# Patient Record
Sex: Male | Born: 1965 | Race: Black or African American | Hispanic: No | Marital: Single | Smoking: Never smoker
Health system: Southern US, Community
[De-identification: ages and names within clinical notes are randomized; demographics above are authoritative.]

## PROBLEM LIST (undated history)

## (undated) DIAGNOSIS — S52501A Unspecified fracture of the lower end of right radius, initial encounter for closed fracture: Secondary | ICD-10-CM

## (undated) DIAGNOSIS — F1010 Alcohol abuse, uncomplicated: Secondary | ICD-10-CM

## (undated) HISTORY — PX: KNEE ARTHROSCOPY: SUR90

---

## 1998-04-05 ENCOUNTER — Emergency Department (HOSPITAL_COMMUNITY): Admission: EM | Admit: 1998-04-05 | Discharge: 1998-04-05 | Payer: Self-pay | Admitting: Emergency Medicine

## 1999-11-14 ENCOUNTER — Emergency Department (HOSPITAL_COMMUNITY): Admission: EM | Admit: 1999-11-14 | Discharge: 1999-11-14 | Payer: Self-pay | Admitting: Emergency Medicine

## 2002-08-04 ENCOUNTER — Emergency Department (HOSPITAL_COMMUNITY): Admission: EM | Admit: 2002-08-04 | Discharge: 2002-08-04 | Payer: Self-pay | Admitting: Emergency Medicine

## 2003-01-17 ENCOUNTER — Emergency Department (HOSPITAL_COMMUNITY): Admission: EM | Admit: 2003-01-17 | Discharge: 2003-01-17 | Payer: Self-pay | Admitting: Emergency Medicine

## 2003-01-17 ENCOUNTER — Encounter: Payer: Self-pay | Admitting: Emergency Medicine

## 2003-04-19 ENCOUNTER — Emergency Department (HOSPITAL_COMMUNITY): Admission: EM | Admit: 2003-04-19 | Discharge: 2003-04-19 | Payer: Self-pay | Admitting: Emergency Medicine

## 2003-04-22 ENCOUNTER — Emergency Department (HOSPITAL_COMMUNITY): Admission: EM | Admit: 2003-04-22 | Discharge: 2003-04-22 | Payer: Self-pay | Admitting: Emergency Medicine

## 2005-09-02 ENCOUNTER — Emergency Department (HOSPITAL_COMMUNITY): Admission: EM | Admit: 2005-09-02 | Discharge: 2005-09-03 | Payer: Self-pay | Admitting: Emergency Medicine

## 2005-11-19 ENCOUNTER — Ambulatory Visit: Payer: Self-pay | Admitting: Family Medicine

## 2005-11-27 ENCOUNTER — Ambulatory Visit: Payer: Self-pay | Admitting: Family Medicine

## 2005-12-05 ENCOUNTER — Ambulatory Visit: Payer: Self-pay | Admitting: *Deleted

## 2005-12-24 HISTORY — PX: ANKLE SURGERY: SHX546

## 2006-02-08 ENCOUNTER — Inpatient Hospital Stay (HOSPITAL_COMMUNITY): Admission: EM | Admit: 2006-02-08 | Discharge: 2006-02-12 | Payer: Self-pay | Admitting: Emergency Medicine

## 2006-02-26 ENCOUNTER — Inpatient Hospital Stay (HOSPITAL_COMMUNITY): Admission: RE | Admit: 2006-02-26 | Discharge: 2006-03-25 | Payer: Self-pay | Admitting: Orthopedic Surgery

## 2006-02-26 ENCOUNTER — Ambulatory Visit: Payer: Self-pay | Admitting: Infectious Diseases

## 2006-02-26 ENCOUNTER — Encounter (INDEPENDENT_AMBULATORY_CARE_PROVIDER_SITE_OTHER): Payer: Self-pay | Admitting: *Deleted

## 2006-03-15 ENCOUNTER — Ambulatory Visit: Payer: Self-pay | Admitting: Cardiology

## 2006-03-15 ENCOUNTER — Encounter: Payer: Self-pay | Admitting: Cardiology

## 2006-03-19 ENCOUNTER — Encounter: Payer: Self-pay | Admitting: Vascular Surgery

## 2006-04-24 ENCOUNTER — Ambulatory Visit: Payer: Self-pay | Admitting: Infectious Diseases

## 2006-05-06 ENCOUNTER — Encounter: Admission: RE | Admit: 2006-05-06 | Discharge: 2006-08-01 | Payer: Self-pay | Admitting: Orthopedic Surgery

## 2006-05-08 IMAGING — CR DG ANKLE COMPLETE 3+V*R*
3 series · 3 of 3 positions shown · non-contrast
Comparison: 02/26/2006.

CLINICAL DATA: Riad fracture.
RIGHT ANKLE ? 3 VIEW:

[t ankle joint ap right]
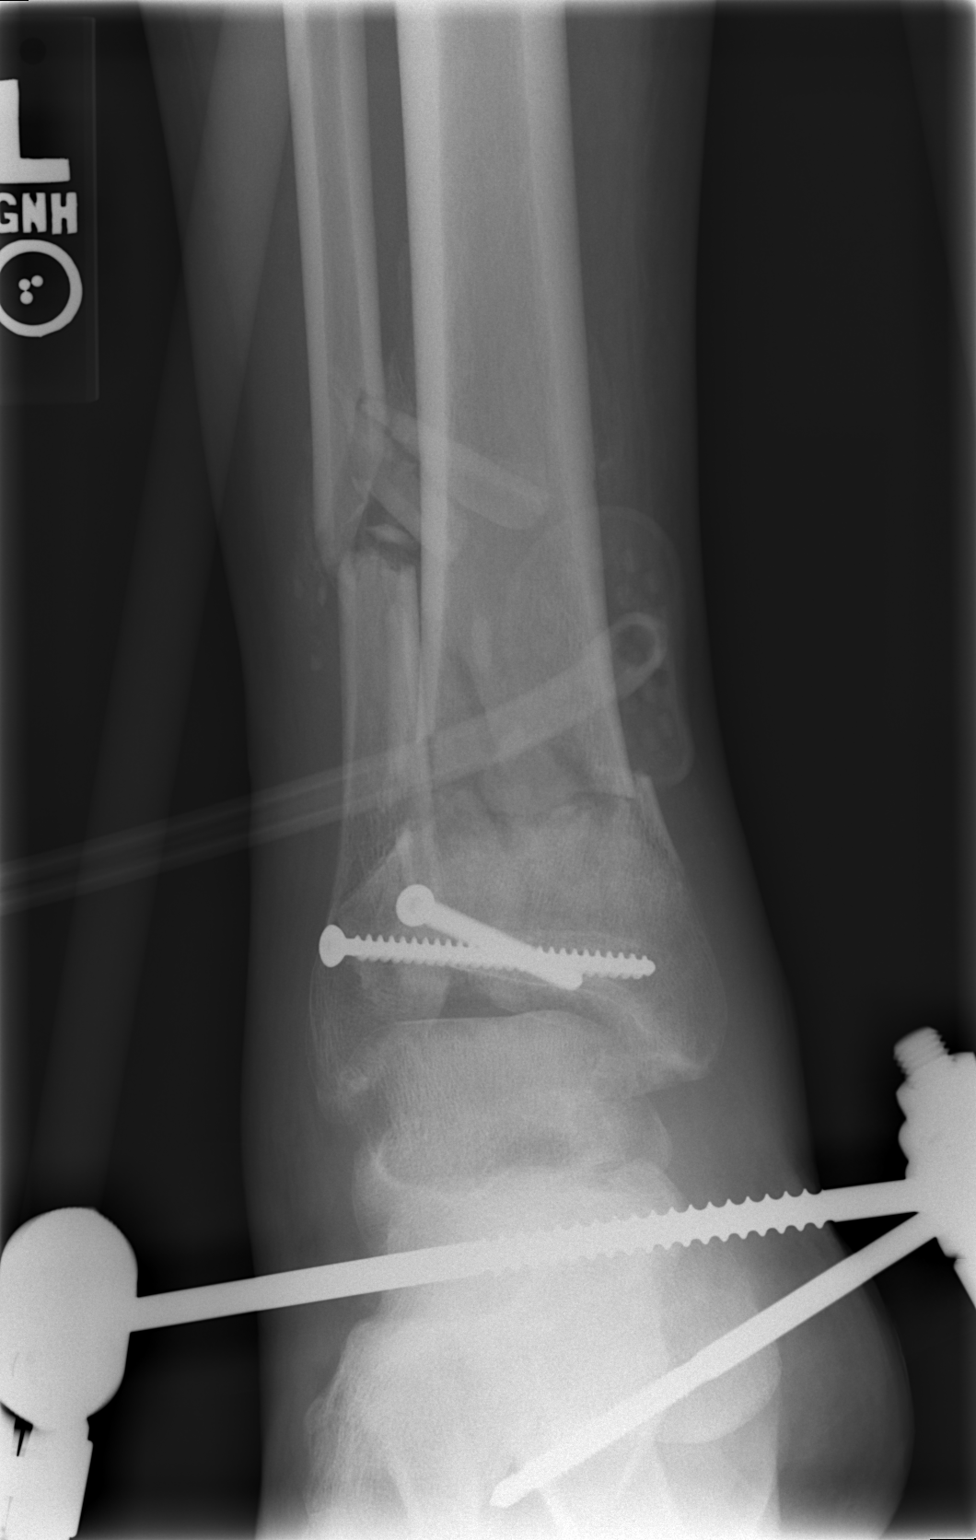

[t ankle joint oblique right]
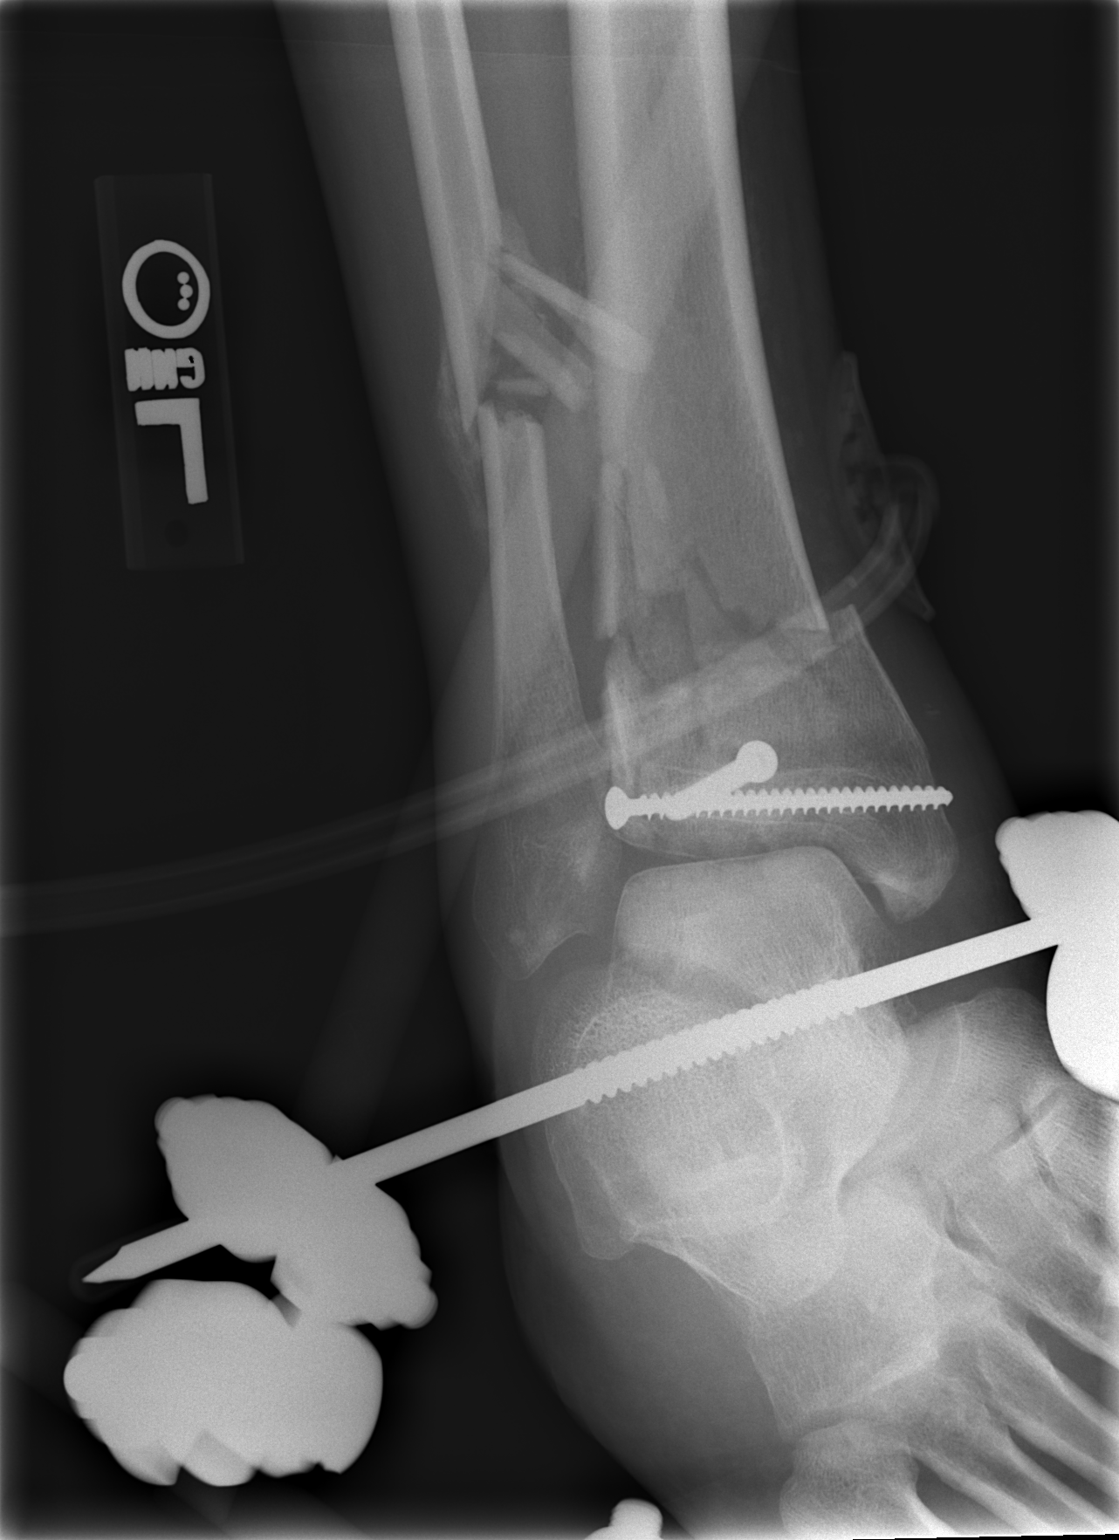

[t ankle joint lat right]
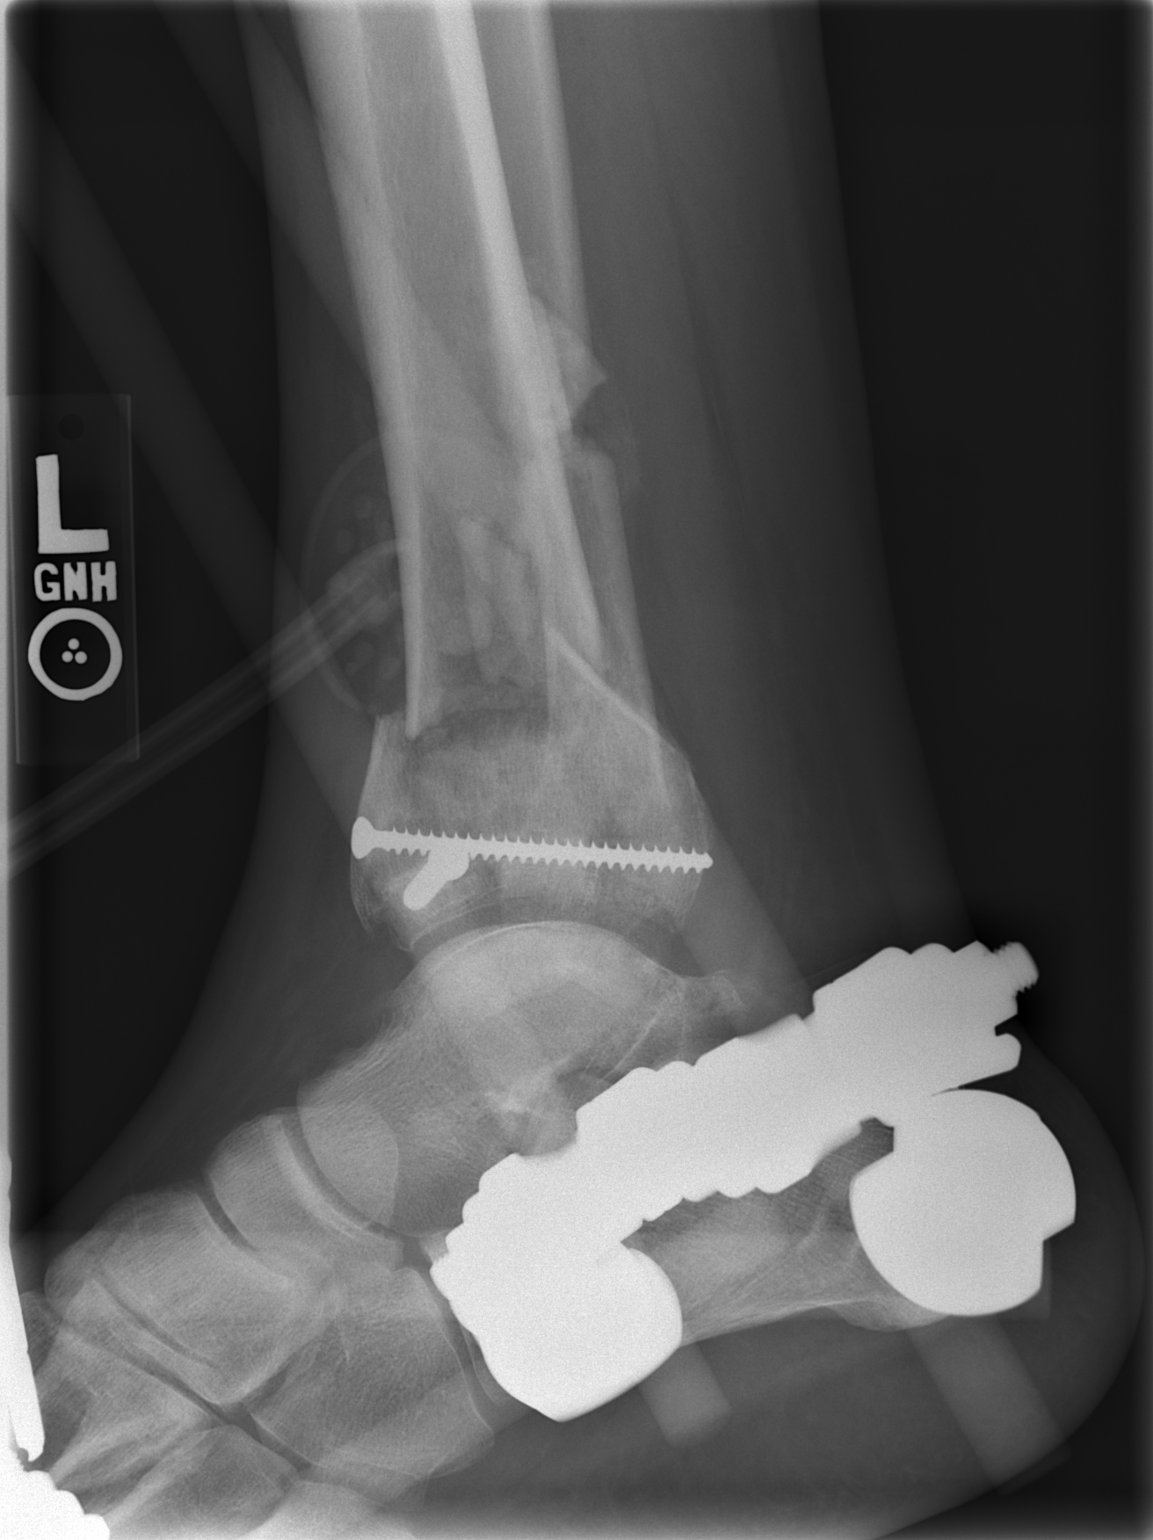

[3 of 3 positions shown; findings below may reference images not displayed]

FINDINGS: There is a highly comminuted fracture of the distal tibia and fibula with extension into the tibiotalar joint.  screw traverses the tibiofibular syndesmosis.  Second screw traverses the distal tibia.  Fracture fragments are in similar anatomic alignment, 02/26/2006.  Subcortical lucency is seen in the talar dome on the oblique view.  There is diffuse osteopenia.
IMPRESSION: 1. Tibial Subedi fracture, unchanged in position and alignment from the previous study.
2. Distal fibular shaft fracture.
3. Subcortical lucency in the talar dome.  Cannot exclude early avascular necrosis.  Attention on follow-up exams warranted.

## 2006-05-29 ENCOUNTER — Ambulatory Visit: Payer: Self-pay | Admitting: Infectious Diseases

## 2007-04-08 ENCOUNTER — Emergency Department (HOSPITAL_COMMUNITY): Admission: EM | Admit: 2007-04-08 | Discharge: 2007-04-08 | Payer: Self-pay | Admitting: Emergency Medicine

## 2007-04-16 ENCOUNTER — Emergency Department (HOSPITAL_COMMUNITY): Admission: EM | Admit: 2007-04-16 | Discharge: 2007-04-16 | Payer: Self-pay | Admitting: Family Medicine

## 2008-03-01 ENCOUNTER — Emergency Department (HOSPITAL_COMMUNITY): Admission: EM | Admit: 2008-03-01 | Discharge: 2008-03-01 | Payer: Self-pay | Admitting: Emergency Medicine

## 2008-03-15 ENCOUNTER — Emergency Department (HOSPITAL_COMMUNITY): Admission: EM | Admit: 2008-03-15 | Discharge: 2008-03-15 | Payer: Self-pay | Admitting: Family Medicine

## 2008-04-20 ENCOUNTER — Emergency Department (HOSPITAL_COMMUNITY): Admission: EM | Admit: 2008-04-20 | Discharge: 2008-04-20 | Payer: Self-pay | Admitting: Family Medicine

## 2008-10-20 ENCOUNTER — Encounter: Admission: RE | Admit: 2008-10-20 | Discharge: 2008-10-20 | Payer: Self-pay | Admitting: Family Medicine

## 2009-06-05 ENCOUNTER — Emergency Department (HOSPITAL_COMMUNITY): Admission: EM | Admit: 2009-06-05 | Discharge: 2009-06-05 | Payer: Self-pay | Admitting: Emergency Medicine

## 2009-09-20 ENCOUNTER — Emergency Department (HOSPITAL_COMMUNITY): Admission: EM | Admit: 2009-09-20 | Discharge: 2009-09-20 | Payer: Self-pay | Admitting: Emergency Medicine

## 2010-04-16 ENCOUNTER — Emergency Department (HOSPITAL_COMMUNITY): Admission: EM | Admit: 2010-04-16 | Discharge: 2010-04-17 | Payer: Self-pay | Admitting: Emergency Medicine

## 2010-05-17 ENCOUNTER — Emergency Department (HOSPITAL_COMMUNITY): Admission: EM | Admit: 2010-05-17 | Discharge: 2010-05-17 | Payer: Self-pay | Admitting: Emergency Medicine

## 2011-05-11 NOTE — Consult Note (Signed)
[name], [name]              ACCOUNT NO.:  [id number]   MEDICAL RECORD NO.:  [id number]          PATIENT TYPE:  INP   LOCATION:  5011                         FACILITY:  [facility]   PHYSICIAN:  Doralee Albino. Carola Frost, M.D. DATE OF BIRTH:  [birth year]   DATE OF CONSULTATION:  02/11/2006  DATE OF DISCHARGE:                                   CONSULTATION   REASON FOR CONSULTATION:  Right grade 2 open distal tibial pilon fracture.   BRIEF HISTORY PRESENTATION:  [name] is a 45 year old male who fell off  a ladder, sustaining a severely comminuted right distal tibia fracture which  was open through a 4 cm wound or so.  He was treated with irrigation,  debridement and spanning external fixation. He denied any numbness, tingling  or any other injury. I was asked to see him for discussion of possible  definitive treatment.   PAST MEDICAL HISTORY:  None.   MEDICATIONS:  Vitamins only.  Currently Percocet and Lovenox for DVT  prophylaxis.   ALLERGIES:  NO KNOWN DRUG ALLERGIES.   SOCIAL HISTORY:  The patient does not smoke. He works as a Paediatric nurse. He drinks  alcohol only occasionally   REVIEW OF SYSTEMS:  Complete review of systems was reviewed with the  patient, and he did not have any significant findings.   PHYSICAL EXAMINATION:  He has a 4 cm medial wound which had some mild  sanguinous drainage. There is no evidence of purulence. He has significant 2-  3+ swelling, which is not associated with any erythema or other skin injury.  He does have a small blister posteromedially. His pin sites appear clean.  Deep peroneal, superficial peroneal and tibial nerve sensory function is  intact.  He is able to demonstrate EHL and lesser toe flexion and extension;  dorsalis pedis pulses 2+ and palpable.   X-rays demonstrated a severely displaced distal tibial pilon fracture, which  is status post external fixation.  It appears to be in excellent alignment  with mild shortening. Rotationally it  appears well aligned in addition.  Again, there is extensive comminution extending into the articular surface.   ASSESSMENT:  Comminuted open right distal tibial pilon fracture, status post  external fixation.   PLAN:  At this time I think it is too risky to proceed with definitive  internal fixation from a soft tissue perspective.  I will plan to see him  back for follow-up in 1 week, at which time we will reassess his soft  tissues.  He will be discharged per Dr. Nilsa Nutting orders, with elevation of the  right lower extremity, crutches and Lovenox for DVT prophylaxis. With regard  to wound care, he can use one layer of Adaptic and gauze dressing -- with or  without Kerlix over that.  Then an Ace wrap underneath his fixator from foot  to the knee.  Pin site should be with dry Kerlix only to prevent skin-pin  interface motion.   I did discuss with the patient the need for definitive internal fixation, as  well as the risks and benefits of this surgery -- including the  possibility  of infection, nerve injury, vessel injury, malunion, nonunion, arthritis,  decreased range of motion, need for further surgery. He understood these and  did wish to proceed.      Doralee Albino. Carola Frost, M.D.  Electronically Signed     MHH/MEDQ  D:  02/11/2006  T:  02/11/2006  Job:  [id number]

## 2011-05-11 NOTE — Op Note (Signed)
NAMEARON, INGE              ACCOUNT NO.:  [id number]   MEDICAL RECORD NO.:  [id number]          PATIENT TYPE:  INP   LOCATION:  5011                         FACILITY:  [facility]   PHYSICIAN:  Madlyn Frankel. Charlann Boxer, M.D.  DATE OF BIRTH:  [birth year]   DATE OF PROCEDURE:  02/08/2006  DATE OF DISCHARGE:                                 OPERATIVE REPORT   PREOPERATIVE DIAGNOSIS:  Grade 2 open distal tib/fib fracture.   POSTOPERATIVE DIAGNOSIS:  Grade 2 open distal tib/fib fracture.   FINDINGS:  The patient was noted to have a 4-5 cm  laceration along the  medial distal leg with exposed bone.  The patient radiographically had a  comminuted distal tibia and distal fibular fracture with questionable  syndesmotic injury.   PROCEDURES:  1.  Right lower leg incision and drainage of skin, subcutaneous tissue,      periosteum, and debris.  2.  Application of external fixator with primary reapproximation of the      wound edges.   SURGEON:  Madlyn Frankel. Charlann Boxer, M.D.   ASSISTANTDruscilla Brownie. Underwood, P.A.-C.   ANESTHESIA:  Spinal plus MAC.   COMPLICATIONS:  None.   ESTIMATED BLOOD LOSS:  Approximately 150 to 200 mL with bone oozing.   INDICATIONS FOR PROCEDURE:  Mr. Howk is a 45 year old gentleman who was  on a ladder working on his windows when the ladder slipped underneath him.  He had immediate onset of deformity with exposed bone and bleeding.  He was  brought to the emergency room  where radiographs revealed the above.  Orthopedics was consulted.  We had initiated a plane of Ancef and gentamicin  with plan for him to receive his tetanus injection, as well, in the  emergency room.  His wound was irrigated with Betadine solution and was  unable to be reduced at the time.  He was then placed in a posterior splint  prior to evaluation.  Radiographs were reviewed.  The risks and benefits of  the procedure and potentials for open reduction and internal fixation versus  external fixator were  discussed, consent was obtained.   PROCEDURE IN DETAIL:  The patient was brought to the operating theater.  Once adequate anesthesia and preoperative antibiotics, Ancef and gentamicin  were administered, the patient was positioned supine with a proximal thigh  tourniquet in place, this was not used.  The right lower extremity was then  prepped and draped in a sterile fashion with Betadine scrub and paint.  Once  this was carried out, the I&D was carried out.  This was done with a  separate drape.  The skin edges were incised followed by debridement of  subcutaneous tissues as well as periosteum.  Following this debridement, the  wound was irrigated with 6 liters of normal saline solution and pulse  lavage.  Note that digital debridement was carried out, as well.  There was  some palpable comminution in the distal posterior aspect of the leg but I  was unable to remove these fragments.  Following this I&D, I did  reapproximate the skin edges.  Subsequently, I  then fit the patient with a  Synthes large external fixator set with a transcalcaneal pin and an A-frame  with two tibial pins.  The fracture was stabilized in a reduced fashion in  AP and lateral planes radiographically.  Following this, the wounds were all  dressed clean and dressed sterilely with Xeroform dressing sponges and  Kerlix.  Following this, the patient was transferred to the recovery room in  stable condition.   I reviewed this case with Dr. Daneil Dolin postoperatively with plans for him  to follow up in the office.  We will have him go home with pain medication,  antibiotics, and DVT prophylaxis.  CT scan will be ordered postoperatively  to help in aid for surgical planning.      Madlyn Frankel Charlann Boxer, M.D.  Electronically Signed     MDO/MEDQ  D:  02/08/2006  T:  02/09/2006  Job:  [id number]

## 2011-05-11 NOTE — Discharge Summary (Signed)
[name], [name]              ACCOUNT NO.:  [id number]   MEDICAL RECORD NO.:  [id number]          PATIENT TYPE:  INP   LOCATION:  5011                         FACILITY:  [facility]   PHYSICIAN:  Madlyn Frankel. Charlann Boxer, M.D.  DATE OF BIRTH:  30-Mar-1966   DATE OF ADMISSION:  02/08/2006  DATE OF DISCHARGE:  02/11/2006                                 DISCHARGE SUMMARY   ADMISSION DIAGNOSIS:  Grade 2 open distal fibular fracture on the right.   DISCHARGE DIAGNOSIS:  Grade 2 open distal fibular fracture on the right.   PROCEDURE:  1.  On February 08, 2006, the patient underwent right lower leg incision and      drainage of skin subcutaneous tissue periosteum and debris.  2.  Application of external fixator with primary reapproximation of wound      edges.   SURGEON:  Durene Romans, M.D.   ASSISTANT:  Alvera Novel, P.A.   CONSULTATIONS:  Dr. Myrene Galas, Orthopedic Trauma Medicine.   HISTORY OF PRESENT ILLNESS:  This 45 year old gentleman was on a ladder  working on windows, when the ladder slipped. The ladder went to the left, he  went to the right, and he fell onto his right foot/ankle. Immediate pain and  deformity and exposed bone and bleeding. Brought to the emergency room where  x-ray revealed a grade 2 open distal tib-fib fracture. The ankle was  stabilized and plaster splints and dressings until operating room time could  be obtained. Later that afternoon, he went into surgery for the above  procedure.   HOSPITAL COURSE:  The patient tolerated the surgery procedure quite well.  Neurovascular remained intact to the left foot. He was placed on antibiotics  postoperatively for prevention of infection. Also placed on Lovenox.   Dr. Myrene Galas, Orthopedic Trauma, saw the patient and has agreed, due to  the significant severity of the fracture, to followup and eventually do  definitive surgery on the patient. The patient is to follow up next Monday  with Dr. Carola Frost at [phone].  On the day of discharge, the patient was awake,  alert, and oriented. The dressing was dry. The pins were intact. The  external fixator was also intact. He is to be discharged home on Lovenox. As  the patient has a limited income, we will ask for patient assistance program  to supply him with Lovenox. The reason for Lovenox is that when the surgery  is scheduled to be done by Dr. Carola Frost, the patient can be off of the Lovenox  and then go ahead with surgery. Were he on any other type of blood thinner,  i.e. Coumadin, aspirin, etc., it would take days for him to be ready for  surgery. The Lovenox reimbursement service and the patient assistance  program forms were filled out.   LABORATORY DATA:  CBC with differential essentially within normal limits.  Blood chemistry showed a mild hypokalemia, otherwise essentially within  normal limits.   No chest x-ray done.   No electrocardiogram done.   CONDITION ON DISCHARGE:  Improved and stable.   FOLLOW UP:  The patient is to  followup with Dr. Carola Frost this next Monday.   SPECIAL INSTRUCTIONS:  Keep the foot elevated.   DISCHARGE MEDICATIONS:  1.  Vicodin for pain.  2.  Robaxin as a muscle relaxer.  3.  Lovenox. He will have instruction and a Lovenox kit prior to discharge.   If he has any difficulty calling Dr. Carola Frost at [phone], then we have asked  him to call us at [phone].      [name].      Madlyn Frankel Charlann Boxer, M.D.  Electronically Signed    DLU/MEDQ  D:  02/11/2006  T:  02/11/2006  Job:  [id number]   cc:   Doralee Albino. Carola Frost, M.D.  Fax: [phone]

## 2011-05-11 NOTE — Consult Note (Signed)
[name], [name]              ACCOUNT NO.:  [id number]   MEDICAL RECORD NO.:  [id number]          PATIENT TYPE:  INP   LOCATION:  5028                         FACILITY:  [facility]   PHYSICIAN:  Cecille Aver, M.D.DATE OF BIRTH:  [birth year]   DATE OF CONSULTATION:  DATE OF DISCHARGE:                                   CONSULTATION   REQUESTING PHYSICIAN:  Lacretia Leigh. Ninetta Lights, M.D.   PRIMARY CARE PHYSICIAN:  Doralee Albino. Carola Frost, M.D.   HISTORY OF PRESENT ILLNESS:  A 45 year old black male with no significant  past medical history.  On February 16, he had a fall and suffered a distal  fibular fracture, which required surgical intervention with external  fixator.  Patient was discharged from that hospitalization on the 19th of  February.  He was then readmitted on March 6 with osteomyelitis of the  operative site.  He is status post irrigation and debridement as well as  antibiotic therapy.  Creatinine on the 7th of March was noted to be 1.1.  Events since that time include surgical intervention, vancomycin therapy,  which was initially not adjusted for any kind of renal dysfunction and  trough level did get up to 34.  There was also a notation of tobramycin,  whether that was topical or intravenously around the time of the operative  intervention.  Patient has also been on Zosyn.  No labs were drawn past  March 7 until March 18 at which time creatinine was noted to be 3.1.  It has  increased slightly since then and is noted to be 3.6 today.  Urinalysis on  the 17th of March was completely negative and on the 19th of March shows 100  of protein, 3-6 rbc's, and 3-6 wbc's per high-powered field.  Renal  ultrasound shows kidney sizes to be fairly normal and greater than 11 cm but  kidneys were noted to be echogenic bilaterally without hydro.  Blood  pressure has been lowish but not extremely low.  Urine output appears to be  very adequate.   PAST MEDICAL HISTORY:  Really essentially  negative.   MEDICATIONS:  Colace, Lovenox, Pepcid, Zosyn, vancomycin, and tobramycin  times one at the time of operative intervention.   ALLERGIES:  NO KNOWN DRUG ALLERGIES.   SOCIAL HISTORY:  Patient admits to minimal alcohol use, lives alone, and is  employed.   FAMILY HISTORY:  Negative for renal disease.   REVIEW OF SYSTEMS:  Positive for right leg pain, previous nausea and  vomiting, and previous chills.  He also is having fevers and constipation.  He denies edema in his nonaffected leg.  He reports normal urine output with  no dysuria, frequency, or hematuria.  He denies any NSAIDs, shortness of  breath, or chest pain.   PHYSICAL EXAMINATION:  VITAL SIGNS:  Temperature daily is 101 degrees, blood  pressure 103/65, heart rate 105, respirations 20.  Oxygen saturation 97% on  room air.  Urine output has been 600-900 cc per shift.  GENERAL:  A well-developed black male who is alert and oriented in no acute  distress.  HEENT:  Pupils are equally round and reactive to light.  Extraocular motions  are intact.  NECK:  There is no jugular venous distention.  No lymphadenopathy.  LUNGS:  Clear.  CARDIOVASCULAR:  Tachycardic without murmur, gallop, or rub.  ABDOMEN:  Positive bowel sounds.  Soft and nontender.  Mildly distended.  EXTREMITIES:  Right lower extremity with an external fixator with a vac in  place with some edema.  In the left lower extremity, there is no edema.   LABS:  White blood count 7.3, hemoglobin 9.9, BUN and creatinine 10 and 3.6,  calcium 9.   Wound cultures have been negative.  Renal ultrasound is noted in history of  present illness.   ASSESSMENT:  A 45 year old black male with acute renal failure in the  setting of leg fracture, osteomyelitis, and supratherapeutic vancomycin  level as well as previous tobramycin.   Acute renal failure, which is nonoliguric.  My thoughts would be secondary  to supratherapeutic vancomycin level plus or minus tobramycin  exposure early  on in course versus a sort of shunt nephritis type picture with the  chronic infection of osteomyelitis.  My other thought would be a possible  hemodynamic cause given low blood pressure and being on narcotic  medications, which would also lower the blood pressure and he has also been  having some vomiting, probably related to pain medicines.  I agree with  holding the vancomycin until level decreases.  I will hydrate gently to make  sure the tank is full and avoid any further nephrotoxins.   Thank you very much for this consultation.  We will continue to follow with  you.           ______________________________  Cecille Aver, M.D.     KAG/MEDQ  D:  03/11/2006  T:  03/12/2006  Job:  [id number]

## 2011-05-11 NOTE — Discharge Summary (Signed)
[name], [name]              ACCOUNT NO.:  [id number]   MEDICAL RECORD NO.:  [id number]          PATIENT TYPE:  INP   LOCATION:  5028                         FACILITY:  [facility]   PHYSICIAN:  Doralee Albino. Carola Frost, M.D. DATE OF BIRTH:  25-Feb-1966   DATE OF ADMISSION:  02/26/2006  DATE OF DISCHARGE:  03/25/2006                                 DISCHARGE SUMMARY   ADMITTING DIAGNOSES:  Right tibial pilon fracture with osteomyelitis,  secondary to fall from ladder.   DISCHARGE DIAGNOSES:  1.  Right tibial pilon fracture with osteomyelitis, secondary to fall from      ladder.  2.  Episode of acute renal failure.   OPERATIONS AND PROCEDURES:  1.  I&D of osteomyelitis, open fracture of tibia pilon, including bone.  2.  Open treatment of tibia-fibula and syndesmosis fracture with external      fixation and limited internal fixation.  3.  Application of Wound-Vac to the right lower extremity, for right tibial      pilon fibula syndesmosis grade 3b open fracture and osteomyelitis.  4.  Cultures were sent; this was done by Dr. Myrene Galas.   CONSULTATIONS:  1.  Renal, for acute renal failure.  2.  Infectious disease, for assistance with management of osteomyelitis and      wound infection.   BRIEF HISTORY:  The patient is a 45 year old black male who fell off of a  ladder, sustaining an open grade 3b tibial pilon fibula and syndesmosis  fracture of the right lower extremity.   HOSPITAL COURSE:  He was admitted for open reduction internal fixation,  status post external fixation previously.  Found to have osteomyelitis from  the open fracture.  He was treated with continued external fixation, limited  internal fixation, I&D for osteomyelitis of the open fracture, and  application of a Wound-Vac.  He tolerated this procedure well and was  stabilized in the recovery room, then placed on the floor on appropriate  pain management and antibiotics.  Also placed on Coumadin, per pharmacy  protocol.  He had been on IV clindamycin and IV vancomycin was started.   Infectious disease was consulted.  He has been on weightbearing of the right  lower extremity.  Continued with Wound-Vac during his hospitalization.   Postoperatively he was eating well, voiding well.  The second day  postoperatively the dressing was changed.  Hemovac drain was pulled.  INR  was 2.0.  He was afebrile.  Infectious disease was consulted for assistance  with antibiotics, and they changed the antibiotics to Zosyn and vancomycin  IV.   The patient continued to make slow progress with Wound-Vac, which was  appropriately changed every few days.  Arrangements were made to send him to  [facility] for possible flap coverage, since there was exposed tendon and bone  (with Dr. Lenis Noon).  During his hospitalization he continued to do fairly  well; afebrile, vital signs stable.  Wound-Vac was in place, and was  reapplied on March 06, 2006.  The calf remained soft; no evidence of DVT.  Continued on IV vancomycin.  The patient remained comfortable.  The wounds  had still exposed Gore-Tex to the bone.  Arrangements were made to send him  to [facility] for evaluation.  Note fever of 103-104.1 on March 09, 2006.  His  creatinine went from 1.1 to 3.1 from February 27, 2006 to March 09, 2006.  Lovenox was given because of his renal function.  The antibiotics were  adjusted.  Insulin coverage was given.  Renal was consulted for evaluation  of acute renal failure.  Renal ultrasound, urine protein, creatinine and UA  were rechecked.  It was felt that the acute renal failure was nonallegoric,  secondary to possibly supratherapeutic vancomycin versus shunt nephritis  versus hemodynamic causes secondary to low blood pressure.  Vancomycin was  held; gently hydrated and we followed him until his renal function  normalized.  He did have some significant nausea during this acute renal  failure, and also was running a fever of 101.8.  He then  continued to make  progress.  Antibiotics were adjusted.  Placed on p.o. Cipro and p.o.  doxycycline; was ready for discharge home on March 25, 2006.  His renal  function had normalized.  He was afebrile.  His Wound-Vac had actually  formed some granulation tissue, and was starting to close over.  The  consultation for possible flap from [facility] was cancelled.  He was ready for  discharge home on March 25, 2006.   Placed on one 81 mg aspirin, enteric-coated, daily.  Weightbearing right  lower extremity.  Cipro and doxycycline p.o..   FOLLOWUPDoralee Albino. Carola Frost, M.D.  After discharge follow up with Infectious  Disease.  Home Wound-Vac to continue healing of his open wound on the right  lower extremity.  Continue with pin site care.   PERTINENT LABS:  Can be obtained from permanent hospital chart.  WBC 10.1,  hemoglobin 12.7, hematocrit 37.0; at time of discharge WBC 12.5, hemoglobin  8.2, hematocrit 24.2.  Discharge chemistries:  Sodium 139, potassium 4.2.  Other chemistries can be obtained from the hospital record.  Creatinine 1.5  (it had been 3.6 on March 11, 2006).  All of his wound cultures can be  obtained from hospital record.   DISCHARGE CONDITION:  Improved.   DISCHARGE INSTRUCTIONS:  Discharge to home on Cipro 500 mg b.i.d. and  doxycycline 100 mg b.i.d.; one enteric-coated aspirin daily.  Weightbearing  of right lower extremity.  Contact our office prior to follow-up if there  are any questions or concerns.      [name].      Doralee Albino. Carola Frost, M.D.  Electronically Signed    SCI/MEDQ  D:  05/07/2006  T:  05/07/2006  Job:  [id number]

## 2011-05-11 NOTE — Op Note (Signed)
[name], [name]              ACCOUNT NO.:  [id number]   MEDICAL RECORD NO.:  [id number]          PATIENT TYPE:  INP   LOCATION:  5028                         FACILITY:  [facility]   PHYSICIAN:  Doralee Albino. Carola Frost, M.D. DATE OF BIRTH:  11-23-1966   DATE OF PROCEDURE:  02/26/2006  DATE OF DISCHARGE:                                 OPERATIVE REPORT   PREOPERATIVE DIAGNOSIS:  Right tibia pilon and fibula and syndesmosis  fractures.   POSTOPERATIVE DIAGNOSES:  1.  Grade 3-B open right tibia pilon, fibular and syndesmosis fractures and      disruption.  2.  Osteomyelitis, right tibia.   PROCEDURES:  1.  Irrigation and debridement of osteomyelitis, right tibia, including      debridement of bone, both cortical and cancellous.  2.  Open treatment of tibial pilon fracture, fibular fracture and      syndesmosis, with limited internal fixation and spanning external      fixation.  3.  Application of unilateral frame.  4.  Application of Wound-V.A.C.   SURGEON:  Doralee Albino. Carola Frost, M.D.   ASSISTANT:  None.   ANESTHESIA:  General.   COMPLICATIONS:  None.   TOURNIQUET:  None.   SPECIMENS:  Six, two deep soft tissue, two hematoma, to bone.   ESTIMATED BLOOD LOSS:  180 mL.   DISPOSITION:  To PACU.   CONDITION:  Stable.   BRIEF SUMMARY OF INDICATION FOR PROCEDURE:  [name] is a 45 year old  black male who fell approximately 15 feet, sustaining a severe right distal  tibial fracture, which was opened and was treated with emergent irrigation  and debridement, application of a spanning external fixator by Dr. Charlann Chaney.  He  was discharged from the hospital, placed on Keflex for antibiotic  prophylaxis and presented to the office for further follow-up.  At that time  he had some scant drainage from the medial wound but no erythema and no  purulence.  We discussed the risks and benefits of definitive internal  fixation including the possibility of nerve injury, vessel injury,  infection, delayed union, nonunion, arthritis, DVT, PE and need for further  surgery, among others.  The patient understood these risks and wished to  proceed.  He was then scheduled for internal fixation the following week, at  which time we knew that his soft tissue swelling would be adequately  resolved.   BRIEF DESCRIPTION OF PROCEDURE:  [name] was administered preoperative  gram of Ancef.  He was taken to the operating room, where his right tibia  was prepped and draped in the usual sterile fashion.  We removed the bars  but had not removed the pins, when squeezing his leg resulted in an outflow  of purulent drainage from his medial incision.  At that time we chose to  leave the pins and clean them thoroughly.  We then performed a standard prep  and drape.  The medial incision was extended proximally and distally in  order to achieve a more definitive debridement.  We did not encounter any  major segments of stripped cortical pieces; however, the most distal  4 cm of  the proximal shaft segment was entirely stripped of periosteum.  There was  one completely stripped lateral cortical segment, which was debrided through  a second procedure later in the case, but again nothing in the area of his  medial wound.  After opening the initial incision, two cultures were  obtained, and then after again reproducing the position of injury with  delivery of the shaft through the medial wound, we debrided some cancellous  bone, which was also sent for culture.  The hematoma material was set as  well.  This Gram stain would eventually come back positive for gram-positive  cocci.  The bone segments were sent to pathology and this was read as  numerous polys per high-powered field, well beyond 5.  Again, an aggressive  irrigation and debridement was performed with curettes and removal of  devitalized bone segments.  We used approximately 4 L of pulsatile saline  with a bulb syringe only, as well  as using a continuous irrigation from a  cysto tube.   New drapes and gloves were applied.  The attention was then turned to the  anterolateral aspect of the joint, where a separate incision was made to  expose the anterolateral corner and to visualize the joint surface.  We were  able to mobilize some of the main articular segments into an appropriate  reduction at the joint surface.  There was some anterolateral impaction.  We  used a Architectural technologist in order to jockey these segments into an  appropriate reduction with less than a millimeter or so of step-off.  The  talus did not have any major chondral damage that was visualized.  Two  screws were placed, overdrilling the near cortex with 3.5. one lateral to  medial to close down the anterolateral corner and one anterior to posterior,  again toward the lateral side in order to appose these together.  The  syndesmosis reduced with this maneuver and we elected to proceed with this  reduction rather then place an additional screw, which may be a source of  continued contamination.  We did not encounter any major loose segments over  the lateral aspect of the distal segment other than one 2.5 x 1 cm segment  which was totally stripped and was removed.  There was not a large defect in  which to place some antibiotic beads, and consequently none were placed.  We  then attempted to achieve an appropriate reduction of the tibiotalar joint  and mortise.  We had considerable difficulty preventing valgus once the  medial cortex was out to length and reduced.  Consequently, we elected to  accept a small amount of shortening, which we felt could be advantageous in  terms of bone healing as well and allowed the fracture to translate 2 mm  medially.  We also felt that shortening could contribute to our ability to  close the medial soft tissue defect.  We did need to debride some of the skin, which was completely necrotic at the time of initial  debridement.  A  deep drain was placed in the tibiotalar joint from the lateral wound and  then we attempted closure with advancement of a soft tissue flap over the  medial tibia.  We were unable to achieve closure without undue tension on  the skin and consequently placed a Wound-V.A.C. deep into this remaining 2.5  x 2.5 cm hole.  Sterile gently compressive dressings were applied and then  the patient  a wakened from anesthesia and transported to the PACU in stable  condition.  It should be noted that we did need to change the calcaneal pin  because it was loose, and we also placed first and fifth metatarsal pin  chaser to assist with control of the ankle joint and prevent equinus as a  prolonged course of external fixation is anticipated.   PROGNOSIS:  Mr. Mccamish has a severe injury and a soft tissue defect and  osteomyelitis and we were unable to perform definitive internal fixation  with a plate, but we were able to achieve what should be an acceptable  reduction of the articular surface and stability through a spanning external  fixator.  We have consulted the infectious disease service to assist [place] with  management of this infection.  We anticipate at least weeks of antibiotic  therapy and it may be that we could safely return to the OR at some point  for grafting and/or plating.  We did not plate the fibula at this time  because of what we felt like was infectious risk.  His the fracture did seem  to communicate from the medial side to some medial butterfly fragments,  which were nonetheless not completely stripped of soft tissue attachment.  He remains at increased risk for nonunion, obviously, and persistent  infection.  His two screws may need to be removed eventually because of  their placement through infection.  He is also risk for DVT and PE and will  be started on Coumadin for DVT prophylaxis.      Doralee Albino. Carola Frost, M.D.  Electronically Signed     [facility]  D:   02/26/2006  T:  02/27/2006  Job:  [id number]

## 2011-09-17 LAB — POCT URINALYSIS DIP (DEVICE)
Bilirubin Urine: NEGATIVE
Ketones, ur: NEGATIVE
Specific Gravity, Urine: 1.015
pH: 7

## 2011-09-17 LAB — POCT RAPID STREP A: Streptococcus, Group A Screen (Direct): NEGATIVE

## 2011-09-17 LAB — INFLUENZA A AND B ANTIGEN (CONVERTED LAB): Inflenza A Ag: NEGATIVE

## 2012-10-06 ENCOUNTER — Ambulatory Visit: Payer: Self-pay | Admitting: Emergency Medicine

## 2012-10-06 ENCOUNTER — Encounter: Payer: Self-pay | Admitting: Emergency Medicine

## 2012-10-06 VITALS — BP 100/64 | HR 58 | Temp 97.9°F | Resp 16 | Ht 66.5 in | Wt 185.0 lb

## 2012-10-06 DIAGNOSIS — Z0289 Encounter for other administrative examinations: Secondary | ICD-10-CM

## 2012-10-06 NOTE — Progress Notes (Signed)
  Subjective:    Patient ID: [name], male    DOB: 12-12-1966, 46 y.o.   MRN: [id number]  HPI  DOT physical  Review of Systems  As per HPI, otherwise negative.      Objective:   Physical Exam  GEN: WDWN, NAD, Non-toxic, A & O x 3 HEENT: Atraumatic, Normocephalic. Neck supple. No masses, No LAD. Ears and Nose: No external deformity. CV: RRR, No M/G/R. No JVD. No thrill. No extra heart sounds. PULM: CTA B, no wheezes, crackles, rhonchi. No retractions. No resp. distress. No accessory muscle use. ABD: S, NT, ND, +BS. No rebound. No HSM. EXTR: No c/c/e NEURO Normal gait.  PSYCH: Normally interactive. Conversant. Not depressed or anxious appearing.  Calm demeanor.        Assessment & Plan:  Fit

## 2014-01-10 ENCOUNTER — Encounter (HOSPITAL_COMMUNITY): Payer: Self-pay | Admitting: Emergency Medicine

## 2014-01-10 ENCOUNTER — Emergency Department (HOSPITAL_COMMUNITY): Payer: Self-pay

## 2014-01-10 ENCOUNTER — Emergency Department (HOSPITAL_COMMUNITY)
Admission: EM | Admit: 2014-01-10 | Discharge: 2014-01-10 | Disposition: A | Discharge status: Home / Self Care | Payer: Self-pay | Attending: Emergency Medicine | Admitting: Emergency Medicine

## 2014-01-10 DIAGNOSIS — S60229A Contusion of unspecified hand, initial encounter: Secondary | ICD-10-CM | POA: Insufficient documentation

## 2014-01-10 DIAGNOSIS — IMO0002 Reserved for concepts with insufficient information to code with codable children: Secondary | ICD-10-CM | POA: Insufficient documentation

## 2014-01-10 DIAGNOSIS — Y929 Unspecified place or not applicable: Secondary | ICD-10-CM | POA: Insufficient documentation

## 2014-01-10 DIAGNOSIS — Y939 Activity, unspecified: Secondary | ICD-10-CM | POA: Insufficient documentation

## 2014-01-10 MED ORDER — IBUPROFEN 600 MG PO TABS: 600.0000 mg | ORAL_TABLET | Freq: Four times a day (QID) | ORAL | Status: DC | PRN

## 2014-01-10 NOTE — ED Provider Notes (Signed)
CSN: [id number]     Arrival date & time 01/10/14  1911 History  This chart was scribed for non-physician practitioner Earley FavorGail Thula Stewart, NP working with Nelia Shiobert L Beaton, MD by Danella Maiersaroline Early, ED Scribe. This patient was seen in room WTR7/WTR7 and the patient's care was started at 8:36 PM.    Chief Complaint  Patient presents with  . Hand Injury    left   The history is provided by the patient. No language interpreter was used.   HPI Comments: [name] is a 48 y.o. male who presents to the Emergency Department complaining of left hand injury with associated pain and swelling after hitting a wall 2 days ago. Pt states he "thinks" he hit a wall.   History reviewed. No pertinent past medical history. Past Surgical History  Procedure Laterality Date  . Ankle surgery  2007   Family History  Problem Relation Age of Onset  . Hypertension Mother    History  Substance Use Topics  . Smoking status: Never Smoker   . Smokeless tobacco: Not on file  . Alcohol Use: Yes    Review of Systems  Musculoskeletal: Positive for arthralgias. Negative for joint swelling.  Skin: Negative for wound.  All other systems reviewed and are negative.    Allergies  Review of patient's allergies indicates no known allergies.  Home Medications   Current Outpatient Rx  Name  Route  Sig  Dispense  Refill  . ibuprofen (ADVIL,MOTRIN) 600 MG tablet   Oral   Take 1 tablet (600 mg total) by mouth every 6 (six) hours as needed.   30 tablet   0    BP 136/87  Pulse 60  Temp(Src) 98.6 F (37 C) (Oral)  Resp 18  SpO2 95% Physical Exam  Nursing note and vitals reviewed. Constitutional: He is oriented to person, place, and time. He appears well-developed and well-nourished. No distress.  HENT:  Head: Normocephalic and atraumatic.  Eyes: EOM are normal.  Neck: Normal range of motion.  Cardiovascular: Normal rate.   Pulmonary/Chest: Effort normal. No respiratory distress.  Musculoskeletal: Normal  range of motion. He exhibits tenderness. He exhibits no edema.       Hands: Neurological: He is alert and oriented to person, place, and time.  Skin: Skin is warm and dry.  Psychiatric: He has a normal mood and affect. His behavior is normal.    ED Course  Procedures (including critical care time) Medications - No data to display  DIAGNOSTIC STUDIES: Oxygen Saturation is 95% on RA, normal by my interpretation.    COORDINATION OF CARE: 8:38 PM- Discussed treatment plan with pt. Pt agrees to plan.    Labs Review Labs Reviewed - No data to display Imaging Review Dg Hand Complete Left  01/10/2014   CLINICAL DATA:  Trauma and pain.  EXAM: LEFT HAND - COMPLETE 3+ VIEW  COMPARISON:  09/03/2005  FINDINGS: No acute fracture or dislocation. Scattered degenerative changes, mild.  IMPRESSION: No acute osseous abnormality.   Electronically Signed   By: Jeronimo GreavesKyle  Talbot M.D.   On: 01/10/2014 20:10    EKG Interpretation   None       MDM   1. Contusion, hand     X-ray, reviewed again, by myself.  No fracture identified in the area of injury  I personally performed the services described in this documentation, which was scribed in my presence. The recorded information has been reviewed and is accurate.  Arman FilterGail K Shonte Beutler, NP 01/10/14 2045

## 2014-01-10 NOTE — Discharge Instructions (Signed)
Hand Contusion  A hand contusion is a deep bruise to the hand. Contusions happen when an injury causes bleeding under the skin. Signs of bruising include pain, puffiness (swelling), and discolored skin. The contusion may turn blue, purple, or yellow. HOME CARE  Put ice on the injured area.  Put ice in a plastic bag.  Place a towel between your skin and the bag.  Leave the ice on for 15-20 minutes, 03-04 times a day.  Only take medicines as told by your doctor.  Use an elastic wrap only as told. You may remove the wrap for sleeping, showering, and bathing. Take the wrap off if you lose feeling (have numbness) in your fingers, or they turn blue or cold. Put the wrap on more loosely.  Keep the hand raised (elevated) with pillows.  Avoid using your hand too much if it painful. GET HELP RIGHT AWAY IF:   You have more redness, puffiness, or pain in your hand.  Your puffiness or pain does not get better with medicine.  You lose feeling in your hand, or you cannot move your fingers.  Your hand turns cold or blue.  You have pain when you move your fingers.  Your hand feels warm.  Your contusion does not get better in 2 days. MAKE SURE YOU:   Understand these instructions.  Will watch this condition.  Will get help right away if you are not doing well or you get worse. Document Released: 05/28/2008 Document Revised: 09/03/2012 Document Reviewed: 06/02/2012 [facility] Patient Information 2014 [place], [place]. There is no fracture noted on todays xray

## 2014-01-10 NOTE — ED Notes (Signed)
Patient is alert and oriented x3.  He is complaining of left hand injury after hitting a wall. Patient has notable swelling to the left hand.  Currently he rates his pain 4 of 10 with movement.

## 2014-01-13 NOTE — ED Provider Notes (Signed)
Medical screening examination/treatment/procedure(s) were performed by non-physician practitioner and as supervising physician I was immediately available for consultation/collaboration.   Teresha Hanks L Malosi Hemstreet, MD 01/13/14 2136 

## 2014-10-27 ENCOUNTER — Encounter (HOSPITAL_COMMUNITY): Payer: Self-pay

## 2014-10-27 ENCOUNTER — Emergency Department (HOSPITAL_COMMUNITY)
Admission: EM | Admit: 2014-10-27 | Discharge: 2014-10-27 | Disposition: A | Discharge status: Home / Self Care | Payer: Self-pay | Attending: Emergency Medicine | Admitting: Emergency Medicine

## 2014-10-27 DIAGNOSIS — Z0130 Encounter for examination of blood pressure without abnormal findings: Secondary | ICD-10-CM | POA: Insufficient documentation

## 2014-10-27 NOTE — ED Provider Notes (Signed)
CSN: [id number]     Arrival date & time 10/27/14  1550 History  This chart was scribed for non-physician practitioner working with Ethelda ChickMartha K Linker, MD by Richarda Overlieichard Holland, ED Scribe. This patient was seen in room WTR5/WTR5 and the patient's care was started at 4:45 PM.    Chief Complaint  Patient presents with  . Blood Pressure Check   The history is provided by the patient. No language interpreter was used.   HPI Comments: [name] is a 48 y.o. male who presents to the Emergency Department complaining of possible high blood pressure. Per pt, he was told he had high BP while at work with a blood pressure of 150/100 last week and wanted to get it checked. Pt states he sometimes gets a pressure in his head while his blood pressure is being checked, however pressure today during BP check. He denies CP, SOB, HA and vision changes as symptoms. He reports no associated symptoms or modifying factors at this time. No hx of HTN.  History reviewed. No pertinent past medical history. Past Surgical History  Procedure Laterality Date  . Ankle surgery  2007   Family History  Problem Relation Age of Onset  . Hypertension Mother    History  Substance Use Topics  . Smoking status: Never Smoker   . Smokeless tobacco: Not on file  . Alcohol Use: Yes    Review of Systems   10 Systems reviewed and are negative for acute change except as noted in the HPI.  Allergies  Review of patient's allergies indicates no known allergies.  Home Medications   Prior to Admission medications   Medication Sig Start Date End Date Taking? Authorizing Provider  ibuprofen (ADVIL,MOTRIN) 600 MG tablet Take 1 tablet (600 mg total) by mouth every 6 (six) hours as needed. 01/10/14   Arman FilterGail K Schulz, NP   BP 124/82 mmHg  Pulse 74  Temp(Src) 98.2 F (36.8 C) (Oral)  Resp 16  SpO2 98% Physical Exam  Constitutional: He is oriented to person, place, and time. He appears well-developed and well-nourished. No  distress.  HENT:  Head: Normocephalic and atraumatic.  Eyes: Conjunctivae and EOM are normal.  Neck: Normal range of motion. Neck supple.  Cardiovascular: Normal rate, regular rhythm and normal heart sounds.   Pulmonary/Chest: Effort normal and breath sounds normal.  Musculoskeletal: Normal range of motion. He exhibits no edema.  Neurological: He is alert and oriented to person, place, and time.  Skin: Skin is warm and dry.  Psychiatric: He has a normal mood and affect. His behavior is normal.  Nursing note and vitals reviewed.   ED Course  Procedures  DIAGNOSTIC STUDIES: Oxygen Saturation is 100% on RA, normal by my interpretation.    COORDINATION OF CARE: 5:01 PM Discussed treatment plan with pt at bedside and pt agreed to plan.   Labs Review Labs Reviewed - No data to display  Imaging Review No results found.   EKG Interpretation None      MDM   Final diagnoses:  Blood pressure check   Patient presenting with concerns of high blood pressure. He is nontoxic appearing and in no apparent distress. Vital signs stable. Blood pressure checked twice in the emergency department, 127/72 and 124/82. Asymptomatic. I advised patient to follow-up with one of the resources to establish care with a primary care physician. Reassurance given. He states he needs documentation for work but his blood pressure is okay. Blood pressures recorded for patient to bring to his workplace.  Stable for discharge. Return precautions given. Patient states understanding of treatment care plan and is agreeable.  I personally performed the services described in this documentation, which was scribed in my presence. The recorded information has been reviewed and is accurate.  Kathrynn SpeedRobyn M Staysha Truby, PA-C 10/27/14 1704  Ethelda ChickMartha K Linker, MD 10/27/14 [phone]

## 2014-10-27 NOTE — Discharge Instructions (Signed)
Your blood pressure today was within normal limits. Follow-up with the wellness clinic or one of the resources below to establish care with a primary care physician.  Managing Your High Blood Pressure Blood pressure is a measurement of how forceful your blood is pressing against the walls of the arteries. Arteries are muscular tubes within the circulatory system. Blood pressure does not stay the same. Blood pressure rises when you are active, excited, or nervous; and it lowers during sleep and relaxation. If the numbers measuring your blood pressure stay above normal most of the time, you are at risk for health problems. High blood pressure (hypertension) is a long-term (chronic) condition in which blood pressure is elevated. A blood pressure reading is recorded as two numbers, such as 120 over 80 (or 120/80). The first, higher number is called the systolic pressure. It is a measure of the pressure in your arteries as the heart beats. The second, lower number is called the diastolic pressure. It is a measure of the pressure in your arteries as the heart relaxes between beats.  Keeping your blood pressure in a normal range is important to your overall health and prevention of health problems, such as heart disease and stroke. When your blood pressure is uncontrolled, your heart has to work harder than normal. High blood pressure is a very common condition in adults because blood pressure tends to rise with age. Men and women are equally likely to have hypertension but at different times in life. Before age 46, men are more likely to have hypertension. After 46 years of age, women are more likely to have it. Hypertension is especially common in African Americans. This condition often has no signs or symptoms. The cause of the condition is usually not known. Your caregiver can help you come up with a plan to keep your blood pressure in a normal, healthy range. BLOOD PRESSURE STAGES Blood pressure is classified  into four stages: normal, prehypertension, stage 1, and stage 2. Your blood pressure reading will be used to determine what type of treatment, if any, is necessary. Appropriate treatment options are tied to these four stages:  Normal  Systolic pressure (mm Hg): below 120.  Diastolic pressure (mm Hg): below 80. Prehypertension  Systolic pressure (mm Hg): 120 to 139.  Diastolic pressure (mm Hg): 80 to 89. Stage1  Systolic pressure (mm Hg): 140 to 159.  Diastolic pressure (mm Hg): 90 to 99. Stage2  Systolic pressure (mm Hg): 160 or above.  Diastolic pressure (mm Hg): 100 or above. RISKS RELATED TO HIGH BLOOD PRESSURE Managing your blood pressure is an important responsibility. Uncontrolled high blood pressure can lead to:  A heart attack.  A stroke.  A weakened blood vessel (aneurysm).  Heart failure.  Kidney damage.  Eye damage.  Metabolic syndrome.  Memory and concentration problems. HOW TO MANAGE YOUR BLOOD PRESSURE Blood pressure can be managed effectively with lifestyle changes and medicines (if needed). Your caregiver will help you come up with a plan to bring your blood pressure within a normal range. Your plan should include the following: Education  Read all information provided by your caregivers about how to control blood pressure.  Educate yourself on the latest guidelines and treatment recommendations. New research is always being done to further define the risks and treatments for high blood pressure. Lifestylechanges  Control your weight.  Avoid smoking.  Stay physically active.  Reduce the amount of salt in your diet.  Reduce stress.  Control any chronic conditions, such  as high cholesterol or diabetes.  Reduce your alcohol intake. Medicines  Several medicines (antihypertensive medicines) are available, if needed, to bring blood pressure within a normal range. Communication  Review all the medicines you take with your caregiver because  there may be side effects or interactions.  Talk with your caregiver about your diet, exercise habits, and other lifestyle factors that may be contributing to high blood pressure.  See your caregiver regularly. Your caregiver can help you create and adjust your plan for managing high blood pressure. RECOMMENDATIONS FOR TREATMENT AND FOLLOW-UP  The following recommendations are based on current guidelines for managing high blood pressure in nonpregnant adults. Use these recommendations to identify the proper follow-up period or treatment option based on your blood pressure reading. You can discuss these options with your caregiver.  Systolic pressure of 120 to 139 or diastolic pressure of 80 to 89: Follow up with your caregiver as directed.  Systolic pressure of 140 to 160 or diastolic pressure of 90 to 100: Follow up with your caregiver within 2 months.  Systolic pressure above 160 or diastolic pressure above 100: Follow up with your caregiver within 1 month.  Systolic pressure above 180 or diastolic pressure above 110: Consider antihypertensive therapy; follow up with your caregiver within 1 week.  Systolic pressure above 200 or diastolic pressure above 120: Begin antihypertensive therapy; follow up with your caregiver within 1 week. Document Released: 09/03/2012 Document Reviewed: 09/03/2012 [facility] Patient Information 2015 [place], [place]. This information is not intended to replace advice given to you by your health care provider. Make sure you discuss any questions you have with your health care provider. How to Take Your Blood Pressure HOW DO I GET A BLOOD PRESSURE MACHINE?  You can buy an electronic home blood pressure machine at your local pharmacy. Insurance will sometimes cover the cost if you have a prescription.  Ask your doctor what type of machine is best for you. There are different machines for your arm and your wrist.  If you decide to buy a machine to check your blood  pressure on your arm, first check the size of your arm so you can buy the right size cuff. To check the size of your arm:   Use a measuring tape that shows both inches and centimeters.   Wrap the measuring tape around the upper-middle part of your arm. You may need someone to help you measure.   Write down your arm measurement in both inches and centimeters.   To measure your blood pressure correctly, it is important to have the right size cuff.   If your arm is up to 13 inches (up to 34 centimeters), get an adult cuff size.  If your arm is 13 to 17 inches (35 to 44 centimeters), get a large adult cuff size.    If your arm is 17 to 20 inches (45 to 52 centimeters), get an adult thigh cuff.  WHAT DO THE NUMBERS MEAN?   There are two numbers that make up your blood pressure. For example: 120/80.  The first number (120 in our example) is called the "systolic pressure." It is a measure of the pressure in your blood vessels when your heart is pumping blood.  The second number (80 in our example) is called the "diastolic pressure." It is a measure of the pressure in your blood vessels when your heart is resting between beats.  Your doctor will tell you what your blood pressure should be. WHAT SHOULD I DO  BEFORE I CHECK MY BLOOD PRESSURE?   Try to rest or relax for at least 30 minutes before you check your blood pressure.  Do not smoke.  Do not have any drinks with caffeine, such as:  Soda.  Coffee.  Tea.  Check your blood pressure in a quiet room.  Sit down and stretch out your arm on a table. Keep your arm at about the level of your heart. Let your arm relax.  Make sure that your legs are not crossed. HOW DO I CHECK MY BLOOD PRESSURE?  Follow the directions that came with your machine.  Make sure you remove any tight-fighting clothing from your arm or wrist. Wrap the cuff around your upper arm or wrist. You should be able to fit a finger between the cuff and your arm.  If you cannot fit a finger between the cuff and your arm, it is too tight and should be removed and rewrapped.  Some units require you to manually pump up the arm cuff.  Automatic units inflate the cuff when you press a button.  Cuff deflation is automatic in both models.  After the cuff is inflated, the unit measures your blood pressure and pulse. The readings are shown on a monitor. Hold still and breathe normally while the cuff is inflated.  Getting a reading takes less than a minute.  Some models store readings in a memory. Some provide a printout of readings. If your machine does not store your readings, keep a written record.  Take readings with you to your next visit with your doctor. Document Released: 11/22/2008 Document Revised: 04/26/2014 Document Reviewed: 02/04/2014 [facility] Patient Information 2015 [place]. This information is not intended to replace advice given to you by your health care provider. Make sure you discuss any questions you have with your health care provider. RESOURCE GUIDE  Chronic Pain Problems: Contact [facility]  [phone] Patients need to be referred by their primary care doctor.  Insufficient Money for Medicine: Contact [employer] Way:  call "211."   No Primary Care Doctor: - Call [facility]  [phone] - can help you locate a primary care doctor that  accepts your insurance, provides certain services, etc. - Physician Referral Service- [phone]  Agencies that provide inexpensive medical care: - [facility]  [phone] - [facility]  [phone] - [facility]  [phone] - Women[facility] Clinic  [phone] - [facility]  [phone] [facility]- [facility]  [phone]  Medicaid-accepting [facility] [place] Providers: - [facility]- [address]  [phone], Mon-Fri 9am-7pm, Sat 9am-1pm - [facility]- [address], [address]  [place]  [phone] - [facility]- [address], [address] [place]  [phone] [facility]- [address]  [phone] - Renaye RakersVeita Bland- [address], [address], [phone]  Only accepts [place] patients after they have their name  applied to their card  Self Pay (no insurance) in [place]: - Sickle Cell Patients: Dr Willey BladeEric Dean, [facility]  [address], [phone] - [facility]- [address]  [phone]       Redge Gainer-     [facility]- [address]       -     [facility]- see information above (Speak to [facility] if you do not have  insurance)       -  [facility]- [address],  [phone]       -  [facility]- [address] [phone]       -  Dr Julio Sicks-  [address], [place], [phone]       -  [facility] - [address] [phone]       -  [facility]- [address] [phone], also [address], [phone]       -    [facility]- [address] [place], [phone], 1st & 3rd Saturday        every month, 10am-1pm  1) Find a Doctor and Pay Out of Pocket Although you won't have to find out who is covered by your insurance plan, it is a good idea to ask around and get recommendations. You will then need to call the office and see if the doctor you have chosen will accept you as a new patient and what types of options they offer for patients who are self-pay. Some doctors offer discounts or will set up payment plans for their patients who do not have insurance, but you will need to ask so you aren't surprised when you get to your appointment.  2) Contact Your Local Health Department Not all health departments have doctors that can see patients for sick visits, but many do, so it is worth a call to see if yours does. If you don't know where your local  health department is, you can check in your phone book. The CDC also has a tool to help you locate your state's health department, and many state websites also have listings of all of their local health departments.  3) Find a Walk-in Clinic If your illness is not likely to be very severe or complicated, you may want to try a walk in clinic. These are popping up all over the country in pharmacies, drugstores, and shopping centers. They're usually staffed by nurse practitioners or physician assistants that have been trained to treat common illnesses and complaints. They're usually fairly quick and inexpensive. However, if you have serious medical issues or chronic medical problems, these are probably not your best option

## 2014-10-27 NOTE — ED Notes (Signed)
Per pt,  Pt was told he had high bp.  Pt states he sometimes gets a pressure in his head.  Denies pressure today.  Wants bp checked and needs note for work regarding bp

## 2014-10-28 ENCOUNTER — Ambulatory Visit (INDEPENDENT_AMBULATORY_CARE_PROVIDER_SITE_OTHER): Payer: Self-pay | Admitting: Family Medicine

## 2014-10-28 VITALS — BP 124/72 | HR 83 | Temp 98.6°F | Resp 18 | Ht 66.25 in | Wt 193.0 lb

## 2014-10-28 DIAGNOSIS — Z024 Encounter for examination for driving license: Secondary | ICD-10-CM

## 2014-10-28 DIAGNOSIS — Z021 Encounter for pre-employment examination: Secondary | ICD-10-CM

## 2014-10-28 NOTE — Patient Instructions (Signed)
2 year card. No restrictions. Blood pressure in normal range.

## 2014-10-28 NOTE — Progress Notes (Addendum)
Subjective:  This chart was scribed for Meredith StaggersJeffrey Latoyia Tecson, MD by Haywood PaoNadim Abu Hashem, ED Scribe at [facility].The patient was seen in exam room 10 and the patient's care was started at 4:23 PM.   Patient ID: Nicholas FractionLovell T Chaney, male    DOB: 07-17-1966, 48 y.o.   MRN: [id number]  HPI HPI Comments: [name] is a 48 y.o. male [facility]. His last evaluation was October 2013. Pt has no listed medication or medical problems per medical report. See scanned copy. Pt needs a renewal of his 2 year card.  Pt states he hurt his left rotator chuff  in June. Pt says it does not effect his driving and he has [place] doctor for it in [place]. He thinks it is arthritis over the years and from an injury while working at a [employer] site. He says he was pushing cement and it fell back onto his shoulder.  He has had no recent surgeries or ED visits. Pt states has about 5 drinks a week. Pt denies wearing glasses or contacts, snoring, and fatigue.  There are no active problems to display for this patient.  History reviewed. No pertinent past medical history. Past Surgical History  Procedure Laterality Date  . Ankle surgery  2007   No Known Allergies Prior to Admission medications   Medication Sig Start Date End Date Taking? Authorizing Provider  ibuprofen (ADVIL,MOTRIN) 600 MG tablet Take 1 tablet (600 mg total) by mouth every 6 (six) hours as needed. 01/10/14   Arman FilterGail K Schulz, NP   History   Social History  . Marital Status: Single    Spouse Name: N/A    Number of Children: N/A  . Years of Education: N/A   Occupational History  . truck driver    Social History Main Topics  . Smoking status: Never Smoker   . Smokeless tobacco: Not on file  . Alcohol Use: 0.0 oz/week    0 Not specified per week  . Drug Use: No  . Sexual Activity: Not on file   Other Topics Concern  . Not on file   Social History Narrative    Review of Systems    Constitutional: Negative for fever.   Negative other than listed above or in scanned medical report.    Objective:   Physical Exam  Constitutional: He is oriented to person, place, and time. He appears well-developed and well-nourished.  HENT:  Head: Normocephalic and atraumatic.  Right Ear: External ear normal.  Left Ear: External ear normal.  Mouth/Throat: Oropharynx is clear and moist.  Eyes: Conjunctivae and EOM are normal. Pupils are equal, round, and reactive to light.  Neck: Normal range of motion. Neck supple. No thyromegaly present.  Cardiovascular: Normal rate, regular rhythm, normal heart sounds and intact distal pulses.   Pulmonary/Chest: Effort normal and breath sounds normal. No respiratory distress. He has no wheezes.  Abdominal: Soft. He exhibits no distension. There is no tenderness. Hernia confirmed negative in the right inguinal area and confirmed negative in the left inguinal area.  Musculoskeletal: Normal range of motion. He exhibits no edema or tenderness.  Left shoulder has full ROM Negative empty can 4/5 strength  Lymphadenopathy:    He has no cervical adenopathy.  Neurological: He is alert and oriented to person, place, and time. He has normal reflexes.  Skin: Skin is warm and dry.  Psychiatric: He has a normal mood and affect. His behavior  is normal.  Nursing note and vitals reviewed.   BP 124/72 mmHg  Pulse 83  Temp(Src) 98.6 F (37 C) (Oral)  Resp 18  Ht 5' 6.25" (1.683 m)  Wt 193 lb (87.544 kg)  BMI 30.91 kg/m2  SpO2 97%     Visual Acuity Screening   Right eye Left eye Both eyes  Without correction: 20/15 20/15 20/15   With correction:     Comments: Color -6/6 85% both eyes   Hearing Screening Comments: The patient was able to hear a forced whisper from 10 feet.      Assessment & Plan:   [name] is a 48 y.o. male Encounter for commercial driver medical examination (CDME) Hx of L RTC injury few months ago with only minimal  decrease ER strength. Sufficient ROM and strength to operate vehicle noted.  No concerns on exam or provided history otherwise. 2 year card. See ppwk   No orders of the defined types were placed in this encounter.   Patient Instructions  2 year card. No restrictions. Blood pressure in normal range.   I personally performed the services described in this documentation, which was scribed in my presence. The recorded information has been reviewed and is accurate.

## 2015-09-19 ENCOUNTER — Encounter (HOSPITAL_COMMUNITY): Payer: Self-pay | Admitting: Cardiology

## 2015-09-19 ENCOUNTER — Emergency Department (HOSPITAL_COMMUNITY)
Admission: EM | Admit: 2015-09-19 | Discharge: 2015-09-19 | Disposition: A | Discharge status: Home / Self Care | Payer: Self-pay | Attending: Emergency Medicine | Admitting: Emergency Medicine

## 2015-09-19 DIAGNOSIS — S3992XA Unspecified injury of lower back, initial encounter: Secondary | ICD-10-CM | POA: Insufficient documentation

## 2015-09-19 DIAGNOSIS — Y998 Other external cause status: Secondary | ICD-10-CM | POA: Insufficient documentation

## 2015-09-19 DIAGNOSIS — Y92410 Unspecified street and highway as the place of occurrence of the external cause: Secondary | ICD-10-CM | POA: Insufficient documentation

## 2015-09-19 DIAGNOSIS — M549 Dorsalgia, unspecified: Secondary | ICD-10-CM

## 2015-09-19 DIAGNOSIS — Y9389 Activity, other specified: Secondary | ICD-10-CM | POA: Insufficient documentation

## 2015-09-19 MED ORDER — IBUPROFEN 800 MG PO TABS: 800.0000 mg | ORAL_TABLET | Freq: Three times a day (TID) | ORAL | Status: DC | PRN

## 2015-09-19 MED ORDER — METHOCARBAMOL 500 MG PO TABS: 500.0000 mg | ORAL_TABLET | Freq: Four times a day (QID) | ORAL | Status: DC | PRN
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
## 2015-09-19 NOTE — ED Notes (Signed)
Declined W/C at D/C and was escorted to lobby by RN. 
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
## 2016-04-30 ENCOUNTER — Encounter (HOSPITAL_COMMUNITY): Payer: Self-pay

## 2016-04-30 ENCOUNTER — Emergency Department (HOSPITAL_COMMUNITY): Payer: No Typology Code available for payment source

## 2016-04-30 ENCOUNTER — Emergency Department (HOSPITAL_COMMUNITY)
Admission: EM | Admit: 2016-04-30 | Discharge: 2016-04-30 | Disposition: A | Discharge status: Home / Self Care | Payer: No Typology Code available for payment source | Attending: Emergency Medicine | Admitting: Emergency Medicine

## 2016-04-30 DIAGNOSIS — Z79899 Other long term (current) drug therapy: Secondary | ICD-10-CM | POA: Insufficient documentation

## 2016-04-30 DIAGNOSIS — S5291XA Unspecified fracture of right forearm, initial encounter for closed fracture: Secondary | ICD-10-CM

## 2016-04-30 DIAGNOSIS — S52591A Other fractures of lower end of right radius, initial encounter for closed fracture: Secondary | ICD-10-CM | POA: Insufficient documentation

## 2016-04-30 DIAGNOSIS — Y9355 Activity, bike riding: Secondary | ICD-10-CM | POA: Insufficient documentation

## 2016-04-30 DIAGNOSIS — Y998 Other external cause status: Secondary | ICD-10-CM | POA: Insufficient documentation

## 2016-04-30 DIAGNOSIS — Y92410 Unspecified street and highway as the place of occurrence of the external cause: Secondary | ICD-10-CM | POA: Insufficient documentation

## 2016-04-30 MED ORDER — OXYCODONE-ACETAMINOPHEN 5-325 MG PO TABS: 1.0000 | ORAL_TABLET | ORAL | Status: DC | PRN

## 2016-04-30 MED ORDER — IBUPROFEN 600 MG PO TABS: 600.0000 mg | ORAL_TABLET | Freq: Four times a day (QID) | ORAL | Status: DC | PRN

## 2016-04-30 MED ORDER — OXYCODONE-ACETAMINOPHEN 5-325 MG PO TABS
1.0000 | ORAL_TABLET | ORAL | Status: DC | PRN
  Administered 2016-04-30: 1 via ORAL
  Filled 2016-04-30: qty 1
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
## 2016-04-30 NOTE — ED Notes (Signed)
Notified ortho of order for splint.
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
## 2016-04-30 NOTE — Discharge Instructions (Signed)
°Forearm Fracture °A forearm fracture is a break in one or both of the bones of your arm that are between the elbow and the wrist. Your forearm is made up of two bones: °· Radius. This is the bone on the inside of your arm near your thumb. °· Ulna. This is the bone on the outside of your arm near your little finger. °Middle forearm fractures usually break both the radius and the ulna. Most forearm fractures that involve both the ulna and radius will require surgery. °CAUSES °Common causes of this type of fracture include: °· Falling on an outstretched arm. °· Accidents, such as a car or bike accident. °· A hard, direct hit to the middle part of your arm. °RISK FACTORS °You may be at higher risk for this type of fracture if: °· You play contact sports. °· You have a condition that causes your bones to be weak or thin (osteoporosis). °SIGNS AND SYMPTOMS °A forearm fracture causes pain immediately after the injury. Other signs and symptoms include: °· An abnormal bend or bump in your arm (deformity). °· Swelling. °· Numbness or tingling. °· Tenderness. °· Inability to turn your hand from side to side (rotate). °· Bruising. °DIAGNOSIS °Your health care provider may diagnose a forearm fracture based on: °· Your symptoms. °· Your medical history, including any recent injury. °· A physical exam. Your health care provider will look for any deformity and feel for tenderness over the break. Your health care provider will also check whether the bones are out of place. °· An X-ray exam to confirm the diagnosis and learn more about the type of fracture. °TREATMENT °The goals of treatment are to get the bone or bones in proper position for healing and to keep the bones from moving so they will heal over time. Your treatment will depend on many factors, especially the type of fracture that you have. °· If the fractured bone or bones: °¨ Are in the correct position (nondisplaced), you may only need to wear a cast or a  splint. °¨ Have a slightly displaced fracture, you may need to have the bones moved back into place manually (closed reduction) before the splint or cast is put on. °· You may have a temporary splint before you have a cast. The splint allows room for some swelling. After a few days, a cast can replace the splint. °· You may have to wear the cast for 6-8 weeks or as directed by your health care provider. °· The cast may be changed after about 3 weeks or as directed by your health care provider. °· After your cast is removed, you may need physical therapy to regain full movement in your wrist or elbow. °· You may need emergency surgery if you have: °¨ A fractured bone or bones that are out of position (displaced). °¨ A fracture with multiple fragments (comminuted fracture). °¨ A fracture that breaks the skin (open fracture). This type of fracture may require surgical wires, plates, or screws to hold the bone or bones in place. °· You may have X-rays every couple of weeks to check on your healing. °HOME CARE INSTRUCTIONS °If You Have a Cast: °· Do not stick anything inside the cast to scratch your skin. Doing that increases your risk of infection. °· Check the skin around the cast every day. Report any concerns to your health care provider. You may put lotion on dry skin around the edges of the cast. Do not apply lotion to the skin   underneath the cast. °If You Have a Splint: °· Wear it as directed by your health care provider. Remove it only as directed by your health care provider. °· Loosen the splint if your fingers become numb and tingle, or if they turn cold and blue. °Bathing °· Cover the cast or splint with a watertight plastic bag to protect it from water while you bathe or shower. Do not let the cast or splint get wet. °Managing Pain, Stiffness, and Swelling °· If directed, apply ice to the injured area: °¨ Put ice in a plastic bag. °¨ Place a towel between your skin and the bag. °¨ Leave the ice on for 20  minutes, 2-3 times a day. °· Move your fingers often to avoid stiffness and to lessen swelling. °· Raise the injured area above the level of your heart while you are sitting or lying down. °Driving °· Do not drive or operate heavy machinery while taking pain medicine. °· Do not drive while wearing a cast or splint on a hand that you use for driving. °Activity °· Return to your normal activities as directed by your health care provider. Ask your health care provider what activities are safe for you. °· Perform range-of-motion exercises only as directed by your health care provider. °Safety °· Do not use your injured limb to support your body weight until your health care provider says that you can. °General Instructions °· Do not put pressure on any part of the cast or splint until it is fully hardened. This may take several hours. °· Keep the cast or splint clean and dry. °· Do not use any tobacco products, including cigarettes, chewing tobacco, or electronic cigarettes. Tobacco can delay bone healing. If you need help quitting, ask your health care provider. °· Take medicines only as directed by your health care provider. °· Keep all follow-up visits as directed by your health care provider. This is important. °SEEK MEDICAL CARE IF: °· Your pain medicine is not helping. °· Your cast or splint becomes wet or damaged or suddenly feels too tight. °· Your cast becomes loose. °· You have more severe pain or swelling than you did before the cast. °· You have severe pain when you stretch your fingers. °· You continue to have pain or stiffness in your elbow or your wrist after your cast is removed. °SEEK IMMEDIATE MEDICAL CARE IF: °· You cannot move your fingers. °· You lose feeling in your fingers or your hand. °· Your hand or your fingers turn cold and pale or blue. °· You notice a bad smell coming from your cast. °· You have drainage from underneath your cast. °· You have new stains from blood or drainage that is coming  through your cast. °  °This information is not intended to replace advice given to you by your health care provider. Make sure you discuss any questions you have with your health care provider. °  °Document Released: 12/07/2000 Document Revised: 12/31/2014 Document Reviewed: 07/26/2014 °Elsevier Interactive Patient Education ©2016 [employer] ° ° °Cast or Splint Care °Casts and splints support injured limbs and keep bones from moving while they heal. It is important to care for your cast or splint at home.   °HOME CARE INSTRUCTIONS °· Keep the cast or splint uncovered during the drying period. It can take 24 to 48 hours to dry if it is made of plaster. A fiberglass cast will dry in less than 1 hour. °· Do not rest the cast on anything harder   than a pillow for the first 24 hours. °· Do not put weight on your injured limb or apply pressure to the cast until your health care provider gives you permission. °· Keep the cast or splint dry. Wet casts or splints can lose their shape and may not support the limb as well. A wet cast that has lost its shape can also create harmful pressure on your skin when it dries. Also, wet skin can become infected. °· Cover the cast or splint with a plastic bag when bathing or when out in the rain or snow. If the cast is on the trunk of the body, take sponge baths until the cast is removed. °· If your cast does become wet, dry it with a towel or a blow dryer on the cool setting only. °· Keep your cast or splint clean. Soiled casts may be wiped with a moistened cloth. °· Do not place any hard or soft foreign objects under your cast or splint, such as cotton, toilet paper, lotion, or powder. °· Do not try to scratch the skin under the cast with any object. The object could get stuck inside the cast. Also, scratching could lead to an infection. If itching is a problem, use a blow dryer on a cool setting to relieve discomfort. °· Do not trim or cut your cast or remove padding from inside of  it. °· Exercise all joints next to the injury that are not immobilized by the cast or splint. For example, if you have a long leg cast, exercise the hip joint and toes. If you have an arm cast or splint, exercise the shoulder, elbow, thumb, and fingers. °· Elevate your injured arm or leg on 1 or 2 pillows for the first 1 to 3 days to decrease swelling and pain. It is best if you can comfortably elevate your cast so it is higher than your heart. °SEEK MEDICAL CARE IF:  °· Your cast or splint cracks. °· Your cast or splint is too tight or too loose. °· You have unbearable itching inside the cast. °· Your cast becomes wet or develops a soft spot or area. °· You have a bad smell coming from inside your cast. °· You get an object stuck under your cast. °· Your skin around the cast becomes red or raw. °· You have new pain or worsening pain after the cast has been applied. °SEEK IMMEDIATE MEDICAL CARE IF:  °· You have fluid leaking through the cast. °· You are unable to move your fingers or toes. °· You have discolored (blue or white), cool, painful, or very swollen fingers or toes beyond the cast. °· You have tingling or numbness around the injured area. °· You have severe pain or pressure under the cast. °· You have any difficulty with your breathing or have shortness of breath. °· You have chest pain. °  °This information is not intended to replace advice given to you by your health care provider. Make sure you discuss any questions you have with your health care provider. °  °Document Released: 12/07/2000 Document Revised: 09/30/2013 Document Reviewed: 06/18/2013 °Elsevier Interactive Patient Education ©2016 [employer] ° °

## 2016-05-01 ENCOUNTER — Telehealth (HOSPITAL_BASED_OUTPATIENT_CLINIC_OR_DEPARTMENT_OTHER): Payer: Self-pay | Admitting: Emergency Medicine

## 2016-05-01 ENCOUNTER — Other Ambulatory Visit: Payer: Self-pay | Admitting: Orthopedic Surgery

## 2016-05-02 ENCOUNTER — Encounter (HOSPITAL_BASED_OUTPATIENT_CLINIC_OR_DEPARTMENT_OTHER): Payer: Self-pay | Admitting: *Deleted

## 2016-05-07 ENCOUNTER — Ambulatory Visit (HOSPITAL_BASED_OUTPATIENT_CLINIC_OR_DEPARTMENT_OTHER)
Admission: RE | Admit: 2016-05-07 | Discharge: 2016-05-07 | Disposition: A | Discharge status: Home / Self Care | Payer: Self-pay | Source: Ambulatory Visit | Attending: Orthopedic Surgery | Admitting: Orthopedic Surgery

## 2016-05-07 ENCOUNTER — Encounter (HOSPITAL_BASED_OUTPATIENT_CLINIC_OR_DEPARTMENT_OTHER): Payer: Self-pay | Admitting: Certified Registered"

## 2016-05-07 ENCOUNTER — Ambulatory Visit (HOSPITAL_BASED_OUTPATIENT_CLINIC_OR_DEPARTMENT_OTHER): Payer: Self-pay | Admitting: Certified Registered"

## 2016-05-07 ENCOUNTER — Encounter (HOSPITAL_BASED_OUTPATIENT_CLINIC_OR_DEPARTMENT_OTHER)
Admission: RE | Disposition: A | Discharge status: Home / Self Care | Payer: Self-pay | Source: Ambulatory Visit | Attending: Orthopedic Surgery

## 2016-05-07 DIAGNOSIS — R200 Anesthesia of skin: Secondary | ICD-10-CM | POA: Insufficient documentation

## 2016-05-07 DIAGNOSIS — R202 Paresthesia of skin: Secondary | ICD-10-CM | POA: Insufficient documentation

## 2016-05-07 DIAGNOSIS — S52571A Other intraarticular fracture of lower end of right radius, initial encounter for closed fracture: Secondary | ICD-10-CM | POA: Insufficient documentation

## 2016-05-07 DIAGNOSIS — X58XXXA Exposure to other specified factors, initial encounter: Secondary | ICD-10-CM | POA: Insufficient documentation

## 2016-05-07 HISTORY — PX: OPEN REDUCTION INTERNAL FIXATION (ORIF) DISTAL RADIAL FRACTURE: SHX5989

## 2016-05-07 HISTORY — PX: CARPAL TUNNEL RELEASE: SHX101

## 2016-05-07 HISTORY — DX: Unspecified fracture of the lower end of right radius, initial encounter for closed fracture: S52.501A

## 2016-05-07 HISTORY — DX: Alcohol abuse, uncomplicated: F10.10

## 2016-05-07 SURGERY — OPEN REDUCTION INTERNAL FIXATION (ORIF) DISTAL RADIUS FRACTURE
Anesthesia: Regional | Site: Wrist | Laterality: Right

## 2016-05-07 MED ORDER — BUPIVACAINE-EPINEPHRINE (PF) 0.5% -1:200000 IJ SOLN
INTRAMUSCULAR | Status: DC | PRN
  Administered 2016-05-07: 30 mL via PERINEURAL

## 2016-05-07 MED ORDER — ONDANSETRON HCL 4 MG/2ML IJ SOLN: 4.0000 mg | Freq: Four times a day (QID) | INTRAMUSCULAR | Status: DC | PRN

## 2016-05-07 MED ORDER — FENTANYL CITRATE (PF) 100 MCG/2ML IJ SOLN
INTRAMUSCULAR | Status: AC
  Filled 2016-05-07: qty 2

## 2016-05-07 MED ORDER — PROPOFOL 500 MG/50ML IV EMUL
INTRAVENOUS | Status: AC
  Filled 2016-05-07: qty 50

## 2016-05-07 MED ORDER — ONDANSETRON HCL 4 MG/2ML IJ SOLN
INTRAMUSCULAR | Status: DC | PRN
  Administered 2016-05-07: 4 mg via INTRAVENOUS

## 2016-05-07 MED ORDER — LACTATED RINGERS IV SOLN
INTRAVENOUS | Status: DC
  Administered 2016-05-07: 08:00:00 via INTRAVENOUS
  Administered 2016-05-07: 10 mL/h via INTRAVENOUS

## 2016-05-07 MED ORDER — MIDAZOLAM HCL 2 MG/2ML IJ SOLN
INTRAMUSCULAR | Status: AC
  Filled 2016-05-07: qty 2

## 2016-05-07 MED ORDER — CEFAZOLIN SODIUM-DEXTROSE 2-4 GM/100ML-% IV SOLN
INTRAVENOUS | Status: AC
  Filled 2016-05-07: qty 100

## 2016-05-07 MED ORDER — OXYCODONE HCL 5 MG/5ML PO SOLN: 5.0000 mg | Freq: Once | ORAL | Status: DC | PRN

## 2016-05-07 MED ORDER — PROPOFOL 10 MG/ML IV BOLUS
INTRAVENOUS | Status: DC | PRN
  Administered 2016-05-07: 200 mg via INTRAVENOUS

## 2016-05-07 MED ORDER — EPHEDRINE SULFATE 50 MG/ML IJ SOLN
INTRAMUSCULAR | Status: DC | PRN
Start: 2016-05-07 — End: 2016-05-07
  Administered 2016-05-07 (×2): 10 mg via INTRAVENOUS

## 2016-05-07 MED ORDER — FENTANYL CITRATE (PF) 100 MCG/2ML IJ SOLN
50.0000 ug | INTRAMUSCULAR | Status: DC | PRN
  Administered 2016-05-07: 100 ug via INTRAVENOUS

## 2016-05-07 MED ORDER — HYDROMORPHONE HCL 1 MG/ML IJ SOLN: 0.2500 mg | INTRAMUSCULAR | Status: DC | PRN

## 2016-05-07 MED ORDER — CEFAZOLIN SODIUM-DEXTROSE 2-4 GM/100ML-% IV SOLN
2.0000 g | INTRAVENOUS | Status: AC
  Administered 2016-05-07: 2 g via INTRAVENOUS

## 2016-05-07 MED ORDER — OXYCODONE HCL 5 MG PO TABS: 5.0000 mg | ORAL_TABLET | Freq: Once | ORAL | Status: DC | PRN

## 2016-05-07 MED ORDER — MIDAZOLAM HCL 2 MG/2ML IJ SOLN
1.0000 mg | INTRAMUSCULAR | Status: DC | PRN
  Administered 2016-05-07: 2 mg via INTRAVENOUS
  Administered 2016-05-07: 1 mg via INTRAVENOUS

## 2016-05-07 MED ORDER — GLYCOPYRROLATE 0.2 MG/ML IJ SOLN: 0.2000 mg | Freq: Once | INTRAMUSCULAR | Status: DC | PRN

## 2016-05-07 MED ORDER — OXYCODONE-ACETAMINOPHEN 5-325 MG PO TABS: 1.0000 | ORAL_TABLET | ORAL | Status: DC | PRN

## 2016-05-07 MED ORDER — SCOPOLAMINE 1 MG/3DAYS TD PT72: 1.0000 | MEDICATED_PATCH | Freq: Once | TRANSDERMAL | Status: DC | PRN

## 2016-05-07 MED ORDER — LIDOCAINE 2% (20 MG/ML) 5 ML SYRINGE
INTRAMUSCULAR | Status: AC
  Filled 2016-05-07: qty 5

## 2016-05-07 MED ORDER — LIDOCAINE HCL (CARDIAC) 20 MG/ML IV SOLN
INTRAVENOUS | Status: DC | PRN
Start: 2016-05-07 — End: 2016-05-07
  Administered 2016-05-07: 30 mg via INTRAVENOUS

## 2016-05-07 MED ORDER — CHLORHEXIDINE GLUCONATE 4 % EX LIQD: 60.0000 mL | Freq: Once | CUTANEOUS | Status: DC

## 2016-05-07 MED ORDER — DEXAMETHASONE SODIUM PHOSPHATE 10 MG/ML IJ SOLN
INTRAMUSCULAR | Status: AC
  Filled 2016-05-07: qty 1

## 2016-05-07 MED ORDER — DEXAMETHASONE SODIUM PHOSPHATE 10 MG/ML IJ SOLN
INTRAMUSCULAR | Status: DC | PRN
  Administered 2016-05-07: 10 mg via INTRAVENOUS

## 2016-05-07 MED ORDER — ONDANSETRON HCL 4 MG/2ML IJ SOLN
INTRAMUSCULAR | Status: AC
  Filled 2016-05-07: qty 2

## 2016-05-07 SURGICAL SUPPLY — 80 items
APL SKNCLS STERI-STRIP NONHPOA (GAUZE/BANDAGES/DRESSINGS) ×1
BANDAGE ACE 3X5.8 VEL STRL LF (GAUZE/BANDAGES/DRESSINGS) IMPLANT
BANDAGE ACE 4X5 VEL STRL LF (GAUZE/BANDAGES/DRESSINGS) ×3 IMPLANT
BENZOIN TINCTURE PRP APPL 2/3 (GAUZE/BANDAGES/DRESSINGS) ×3 IMPLANT
BIT DRILL 2 FAST STEP (BIT) ×3 IMPLANT
BIT DRILL 2.5X4 QC (BIT) ×3 IMPLANT
BLADE MINI RND TIP GREEN BEAV (BLADE) IMPLANT
BLADE SURG 15 STRL LF DISP TIS (BLADE) ×1 IMPLANT
BLADE SURG 15 STRL SS (BLADE) ×3
BNDG CMPR 9X4 STRL LF SNTH (GAUZE/BANDAGES/DRESSINGS) ×1
BNDG ESMARK 4X9 LF (GAUZE/BANDAGES/DRESSINGS) ×3 IMPLANT
BNDG GAUZE ELAST 4 BULKY (GAUZE/BANDAGES/DRESSINGS) ×3 IMPLANT
CANISTER SUCT 1200ML W/VALVE (MISCELLANEOUS) IMPLANT
CLOSURE WOUND 1/2 X4 (GAUZE/BANDAGES/DRESSINGS) ×1
CORDS BIPOLAR (ELECTRODE) ×3 IMPLANT
COVER BACK TABLE 60X90IN (DRAPES) ×3 IMPLANT
CUFF TOURNIQUET SINGLE 18IN (TOURNIQUET CUFF) IMPLANT
DECANTER SPIKE VIAL GLASS SM (MISCELLANEOUS) IMPLANT
DRAPE EXTREMITY T 121X128X90 (DRAPE) ×3 IMPLANT
DRAPE OEC MINIVIEW 54X84 (DRAPES) ×3 IMPLANT
DRAPE SURG 17X23 STRL (DRAPES) ×3 IMPLANT
DURAPREP 26ML APPLICATOR (WOUND CARE) ×3 IMPLANT
ELECT REM PT RETURN 9FT ADLT (ELECTROSURGICAL)
ELECTRODE REM PT RTRN 9FT ADLT (ELECTROSURGICAL) IMPLANT
GAUZE SPONGE 4X4 12PLY STRL (GAUZE/BANDAGES/DRESSINGS) ×3 IMPLANT
GAUZE SPONGE 4X4 16PLY XRAY LF (GAUZE/BANDAGES/DRESSINGS) IMPLANT
GAUZE XEROFORM 1X8 LF (GAUZE/BANDAGES/DRESSINGS) IMPLANT
GLOVE BIO SURGEON STRL SZ7.5 (GLOVE) ×3 IMPLANT
GLOVE BIOGEL PI IND STRL 8 (GLOVE) ×1 IMPLANT
GLOVE BIOGEL PI INDICATOR 8 (GLOVE) ×2
GLOVE SURG SYN 8.0 (GLOVE) ×3 IMPLANT
GOWN STRL REUS W/ TWL LRG LVL3 (GOWN DISPOSABLE) IMPLANT
GOWN STRL REUS W/TWL LRG LVL3 (GOWN DISPOSABLE)
GOWN STRL REUS W/TWL XL LVL3 (GOWN DISPOSABLE) ×6 IMPLANT
NEEDLE HYPO 25X1 1.5 SAFETY (NEEDLE) ×3 IMPLANT
NS IRRIG 1000ML POUR BTL (IV SOLUTION) ×3 IMPLANT
PACK BASIN DAY SURGERY FS (CUSTOM PROCEDURE TRAY) ×3 IMPLANT
PAD CAST 3X4 CTTN HI CHSV (CAST SUPPLIES) ×1 IMPLANT
PAD CAST 4YDX4 CTTN HI CHSV (CAST SUPPLIES) IMPLANT
PADDING CAST ABS 4INX4YD NS (CAST SUPPLIES) ×2
PADDING CAST ABS COTTON 4X4 ST (CAST SUPPLIES) ×1 IMPLANT
PADDING CAST COTTON 3X4 STRL (CAST SUPPLIES) ×3
PADDING CAST COTTON 4X4 STRL (CAST SUPPLIES)
PEG DRIVER 2.0MM ×3 IMPLANT
PEG SUBCHONDRAL SMOOTH 2.0X22 (Peg) ×3 IMPLANT
PEG SUBCHONDRAL SMOOTH 2.0X24 (Peg) ×12 IMPLANT
PEG SUBCHONDRAL SMOOTH 2.0X26 (Peg) ×9 IMPLANT
PENCIL BUTTON HOLSTER BLD 10FT (ELECTRODE) IMPLANT
PLATE STAN 24.4X59.5 RT (Plate) ×3 IMPLANT
SCREW BN 12X3.5XNS CORT TI (Screw) ×1 IMPLANT
SCREW CORT 3.5X12 (Screw) ×3 IMPLANT
SCREW CORT 3.5X14 LNG (Screw) ×3 IMPLANT
SCREW CORT 3.5X15 (Screw) ×3 IMPLANT
SHEET MEDIUM DRAPE 40X70 STRL (DRAPES) ×3 IMPLANT
SLEEVE SCD COMPRESS KNEE MED (MISCELLANEOUS) ×3 IMPLANT
SLING ARM FOAM STRAP LRG (SOFTGOODS) ×3 IMPLANT
SPLINT PLASTER CAST XFAST 3X15 (CAST SUPPLIES) IMPLANT
SPLINT PLASTER CAST XFAST 4X15 (CAST SUPPLIES) ×10 IMPLANT
SPLINT PLASTER XTRA FAST SET 4 (CAST SUPPLIES) ×20
SPLINT PLASTER XTRA FASTSET 3X (CAST SUPPLIES)
STOCKINETTE 4X48 STRL (DRAPES) ×3 IMPLANT
STRIP CLOSURE SKIN 1/2X4 (GAUZE/BANDAGES/DRESSINGS) ×2 IMPLANT
SUCTION FRAZIER HANDLE 10FR (MISCELLANEOUS)
SUCTION TUBE FRAZIER 10FR DISP (MISCELLANEOUS) IMPLANT
SUT CHROMIC 3 0 PS 2 (SUTURE) IMPLANT
SUT ETHILON 4 0 PS 2 18 (SUTURE) IMPLANT
SUT MERSILENE 4 0 P 3 (SUTURE) IMPLANT
SUT PROLENE 3 0 PS 2 (SUTURE) ×3 IMPLANT
SUT VIC AB 0 SH 27 (SUTURE) IMPLANT
SUT VIC AB 2-0 PS2 27 (SUTURE) ×3 IMPLANT
SUT VIC AB 3-0 FS2 27 (SUTURE) IMPLANT
SUT VIC AB 4-0 RB1 18 (SUTURE) IMPLANT
SUT VICRYL RAPIDE 4-0 (SUTURE) IMPLANT
SUT VICRYL RAPIDE 4/0 PS 2 (SUTURE) IMPLANT
SYR BULB 3OZ (MISCELLANEOUS) ×3 IMPLANT
SYRINGE 10CC LL (SYRINGE) ×3 IMPLANT
TOWEL OR 17X24 6PK STRL BLUE (TOWEL DISPOSABLE) ×3 IMPLANT
TUBE CONNECTING 20'X1/4 (TUBING)
TUBE CONNECTING 20X1/4 (TUBING) IMPLANT
UNDERPAD 30X30 (UNDERPADS AND DIAPERS) ×3 IMPLANT

## 2016-05-07 NOTE — Anesthesia Preprocedure Evaluation (Addendum)
Anesthesia Evaluation  Patient identified by MRN, date of birth, ID band Patient awake    Reviewed: Allergy & Precautions, NPO status , Patient's Chart, lab work & pertinent test results  Airway Mallampati: II   Neck ROM: full    Dental   Pulmonary neg pulmonary ROS,    breath sounds clear to auscultation       Cardiovascular negative cardio ROS   Rhythm:regular Rate:Normal     Neuro/Psych    GI/Hepatic (+)     substance abuse  alcohol use,   Endo/Other    Renal/GU      Musculoskeletal   Abdominal   Peds  Hematology   Anesthesia Other Findings   Reproductive/Obstetrics                             Anesthesia Physical Anesthesia Plan  ASA: II  Anesthesia Plan: General and Regional   Post-op Pain Management:    Induction: Intravenous  Airway Management Planned: LMA  Additional Equipment:   Intra-op Plan:   Post-operative Plan:   Informed Consent: I have reviewed the patients History and Physical, chart, labs and discussed the procedure including the risks, benefits and alternatives for the proposed anesthesia with the patient or authorized representative who has indicated his/her understanding and acceptance.     Plan Discussed with: CRNA, Anesthesiologist and Surgeon  Anesthesia Plan Comments:         Anesthesia Quick Evaluation

## 2016-05-07 NOTE — Anesthesia Procedure Notes (Addendum)
Anesthesia Regional Block:  Supraclavicular block  Pre-Anesthetic Checklist: ,, timeout performed, Correct Patient, Correct Site, Correct Laterality, Correct Procedure, Correct Position, site marked, Risks and benefits discussed,  Surgical consent,  Pre-op evaluation,  At surgeon's request and post-op pain management  Laterality: Right  Prep: chloraprep       Needles:  Injection technique: Single-shot  Needle Type: Echogenic Stimulator Needle     Needle Length: 5cm 5 cm Needle Gauge: 22 and 22 G    Additional Needles:  Procedures: ultrasound guided (picture in chart) and nerve stimulator Supraclavicular block  Nerve Stimulator or Paresthesia:  Response: biceps flexion, 0.45 mA,   Additional Responses:   Narrative:  Start time: 05/07/2016 7:10 AM End time: 05/07/2016 7:20 AM Injection made incrementally with aspirations every 5 mL.  Performed by: Personally  Anesthesiologist: HODIERNE, ADAM  Additional Notes: Functioning IV was confirmed and monitors were applied.  A 50mm 22ga Arrow echogenic stimulator needle was used. Sterile prep and drape,hand hygiene and sterile gloves were used.  Negative aspiration and negative test dose prior to incremental administration of local anesthetic. The patient tolerated the procedure well.  Ultrasound guidance: relevent anatomy identified, needle position confirmed, local anesthetic spread visualized around nerve(s), vascular puncture avoided.  Image printed for medical record.    Procedure Name: LMA Insertion Date/Time: 05/07/2016 7:37 AM Performed by: Kyron Schlitt D Pre-anesthesia Checklist: Patient identified, Emergency Drugs available, Suction available and Patient being monitored Patient Re-evaluated:Patient Re-evaluated prior to inductionOxygen Delivery Method: Circle System Utilized Preoxygenation: Pre-oxygenation with 100% oxygen Intubation Type: IV induction Ventilation: Mask ventilation without difficulty LMA: LMA  inserted LMA Size: 4.0 Number of attempts: 1 Airway Equipment and Method: Bite block Placement Confirmation: positive ETCO2 Tube secured with: Tape Dental Injury: Teeth and Oropharynx as per pre-operative assessment

## 2016-05-07 NOTE — Op Note (Deleted)
[name], [name]              ACCOUNT NO.:  [id number]  MEDICAL RECORD NO.:  [id number]  LOCATION:                                 FACILITY:  PHYSICIAN:  Artist Pais. Tirza Senteno, M.D.DATE OF BIRTH:  1966/12/11  DATE OF PROCEDURE:  05/07/2016 DATE OF DISCHARGE:                              OPERATIVE REPORT   PREOPERATIVE DIAGNOSIS:  Displaced intra-articular 4-part distal radius fracture, right side with numbness and tingling in the median distribution consistent with carpal tunnel syndrome.  POSTOPERATIVE DIAGNOSIS:  Displaced intra-articular 4-part distal radius fracture, right side with numbness and tingling in the median distribution consistent with carpal tunnel syndrome.  PROCEDURE:  ORIF 4-part intra-articular fracture, distal radius, right side carpal tunnel release.  SURGEON:  Artist Pais. Mina Marble, M.D.  ASSISTANT:  None.  ANESTHESIA:  Ax block and general.  COMPLICATION:  No complications.  DRAINS:  No drains.  DESCRIPTION OF PROCEDURE:  After the induction of adequate axillary block analgesia and general laryngeal mask airway anesthetic, the right upper extremity was prepped and draped in sterile fashion.  An Esmarch was used to exsanguinate the limb.  The tourniquet was inflated to 250 mmHg.  At this point in time, incision was made over the palpable border of the flexor carpi radialis tendon along the palmar side of the right wrist.  We incised the skin.  The interval between the flexor carpi radialis and radial artery was identified.  The sheath overlying the FCR was incised.  The FCR was retracted to the midline.  The radial artery to the lateral side.  The fascia was incised.  Dissection was carried down to the pronator quadratus.  The pronator quadratus had been interrupted from the fracture and there was significant volar displacement of the proximal fragment.  We subperiosteally dissected the remaining parts of the pronator quadratus off both the distal  and proximal fragments including release of brachioradialis off the distal fragment.  Once this was done, flexion ulnar deviation traction was used to reduce the fracture.  We then used fluoroscopy to determine adequate fracture reduction and plate position.  We then placed a standard DVR plate and drilled a single screw through the slotted hole.  We used fluoroscopy to determine plate position.  We then placed 2 more cortical screws proximally followed by the smooth pegs distally.  Intraoperative fluoroscopy revealed adequate reduction, AP, lateral, and oblique views. We identified the median nerve proximally and traced it to the edge of the carpal canal.  We created a path dorsal and volar to the transverse carpal ligament using a Therapist, nutritional and then with the appropriate retractors in place, and the median nerve protected, we released the transverse carpal ligament from proximal to distal.  The wound was thoroughly irrigated.  We then closed the pronator quadratus partially over the plate using 2-0 undyed Vicryl, and 2-0 undyed Vicryl subcutaneously, and then a 3-0 Prolene subcuticular stitch on the skin.  Steri-Strips, 4x4s fluffs, and a volar splint was applied.  The patient tolerated all procedures well and went to recovery room in stable fashion.     Artist Pais Mina Marble, M.D.   ______________________________ Artist Pais. Mina Marble, M.D.    MAW/MEDQ  D:  05/07/2016  T:  05/07/2016  Job:  [id number]

## 2016-05-07 NOTE — Interval H&P Note (Signed)
History and Physical Interval Note:  05/07/2016 7:31 AM  [name]  has presented today for surgery, with the diagnosis of right distal radius fracture  The various methods of treatment have been discussed with the patient and family. After consideration of risks, benefits and other options for treatment, the patient has consented to  Procedure(s): OPEN REDUCTION INTERNAL FIXATION (ORIF) RIGHT DISTAL RADIAL FRACTURE (Right) RIGHT CARPAL TUNNEL RELEASE (Right) as a surgical intervention .  The patient's history has been reviewed, patient examined, no change in status, stable for surgery.  I have reviewed the patient's chart and labs.  Questions were answered to the patient's satisfaction.     Dairl PonderWEINGOLD,Boykin Baetz A

## 2016-05-07 NOTE — Discharge Instructions (Signed)
°  Post Anesthesia Home Care Instructions ° °Activity: °Get plenty of rest for the remainder of the day. A responsible adult should stay with you for 24 hours following the procedure.  °For the next 24 hours, DO NOT: °-Drive a car °-Operate machinery °-Drink alcoholic beverages °-Take any medication unless instructed by your physician °-Make any legal decisions or sign important papers. ° °Meals: °Start with liquid foods such as gelatin or soup. Progress to regular foods as tolerated. Avoid greasy, spicy, heavy foods. If nausea and/or vomiting occur, drink only clear liquids until the nausea and/or vomiting subsides. Call your physician if vomiting continues. ° °Special Instructions/Symptoms: °Your throat may feel dry or sore from the anesthesia or the breathing tube placed in your throat during surgery. If this causes discomfort, gargle with warm salt water. The discomfort should disappear within 24 hours. ° °If you had a scopolamine patch placed behind your ear for the management of post- operative nausea and/or vomiting: ° °1. The medication in the patch is effective for 72 hours, after which it should be removed.  Wrap patch in a tissue and discard in the trash. Wash hands thoroughly with soap and water. °2. You may remove the patch earlier than 72 hours if you experience unpleasant side effects which may include dry mouth, dizziness or visual disturbances. °3. Avoid touching the patch. Wash your hands with soap and water after contact with the patch. °  °Regional Anesthesia Blocks ° °1. Numbness or the inability to move the "blocked" extremity may last from 3-48 hours after placement. The length of time depends on the medication injected and your individual response to the medication. If the numbness is not going away after 48 hours, call your surgeon. ° °2. The extremity that is blocked will need to be protected until the numbness is gone and the  Strength has returned. Because you cannot feel it, you will need  to take extra care to avoid injury. Because it may be weak, you may have difficulty moving it or using it. You may not know what position it is in without looking at it while the block is in effect. ° °3. For blocks in the legs and feet, returning to weight bearing and walking needs to be done carefully. You will need to wait until the numbness is entirely gone and the strength has returned. You should be able to move your leg and foot normally before you try and bear weight or walk. You will need someone to be with you when you first try to ensure you do not fall and possibly risk injury. ° °4. Bruising and tenderness at the needle site are common side effects and will resolve in a few days. ° °5. Persistent numbness or new problems with movement should be communicated to the surgeon or the  Surgery Center ([phone])/ [facility] ([phone]). °
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
## 2016-05-07 NOTE — Op Note (Signed)
See note [id number]

## 2016-05-07 NOTE — Transfer of Care (Signed)
Immediate Anesthesia Transfer of Care Note  Patient: [name]  Procedure(s) Performed: Procedure(s): OPEN REDUCTION INTERNAL FIXATION (ORIF) RIGHT DISTAL RADIAL FRACTURE (Right) RIGHT CARPAL TUNNEL RELEASE (Right)  Patient Location: PACU  Anesthesia Type:GA combined with regional for post-op pain  Level of Consciousness: awake and patient cooperative  Airway & Oxygen Therapy: Patient Spontanous Breathing and Patient connected to face mask oxygen  Post-op Assessment: Report given to RN and Post -op Vital signs reviewed and stable  Post vital signs: Reviewed and stable  Last Vitals:  Filed Vitals:   05/07/16 0720 05/07/16 0725  BP:  122/72  Pulse: 58 69  Temp:    Resp: 13 15    Last Pain:  Filed Vitals:   05/07/16 0727  PainSc: 4       Patients Stated Pain Goal: 2 (05/07/16 [id number])  Complications: No apparent anesthesia complications

## 2016-05-07 NOTE — Op Note (Signed)
NAME:  [name], [name]              ACCOUNT NO.:  [id number]  MEDICAL RECORD NO.:  [id number]  LOCATION:                                 FACILITY:  PHYSICIAN:  Jessenya Berdan A. Hayla Hinger, M.D.DATE OF BIRTH:  10/28/1966  DATE OF PROCEDURE:  05/07/2016 DATE OF DISCHARGE:                              OPERATIVE REPORT   PREOPERATIVE DIAGNOSIS:  Displaced intra-articular 4-part distal radius fracture, right side with numbness and tingling in the median distribution consistent with carpal tunnel syndrome.  POSTOPERATIVE DIAGNOSIS:  Displaced intra-articular 4-part distal radius fracture, right side with numbness and tingling in the median distribution consistent with carpal tunnel syndrome.  PROCEDURE:  ORIF 4-part intra-articular fracture, distal radius, right side carpal tunnel release.  SURGEON:  Gini Caputo A. Lise Pincus, M.D.  ASSISTANT:  None.  ANESTHESIA:  Ax block and general.  COMPLICATION:  No complications.  DRAINS:  No drains.  DESCRIPTION OF PROCEDURE:  After the induction of adequate axillary block analgesia and general laryngeal mask airway anesthetic, the right upper extremity was prepped and draped in sterile fashion.  An Esmarch was used to exsanguinate the limb.  The tourniquet was inflated to 250 mmHg.  At this point in time, incision was made over the palpable border of the flexor carpi radialis tendon along the palmar side of the right wrist.  We incised the skin.  The interval between the flexor carpi radialis and radial artery was identified.  The sheath overlying the FCR was incised.  The FCR was retracted to the midline.  The radial artery to the lateral side.  The fascia was incised.  Dissection was carried down to the pronator quadratus.  The pronator quadratus had been interrupted from the fracture and there was significant volar displacement of the proximal fragment.  We subperiosteally dissected the remaining parts of the pronator quadratus off both the distal  and proximal fragments including release of brachioradialis off the distal fragment.  Once this was done, flexion ulnar deviation traction was used to reduce the fracture.  We then used fluoroscopy to determine adequate fracture reduction and plate position.  We then placed a standard DVR plate and drilled a single screw through the slotted hole.  We used fluoroscopy to determine plate position.  We then placed 2 more cortical screws proximally followed by the smooth pegs distally.  Intraoperative fluoroscopy revealed adequate reduction, AP, lateral, and oblique views. We identified the median nerve proximally and traced it to the edge of the carpal canal.  We created a path dorsal and volar to the transverse carpal ligament using a Freer elevator and then with the appropriate retractors in place, and the median nerve protected, we released the transverse carpal ligament from proximal to distal.  The wound was thoroughly irrigated.  We then closed the pronator quadratus partially over the plate using 2-0 undyed Vicryl, and 2-0 undyed Vicryl subcutaneously, and then a 3-0 Prolene subcuticular stitch on the skin.  Steri-Strips, 4x4s fluffs, and a volar splint was applied.  The patient tolerated all procedures well and went to recovery room in stable fashion.     Ilaria Much A. Beckam Abdulaziz, M.D.   ______________________________ Larri Brewton A. Greenley Martone, M.D.    MAW/MEDQ  D:    05/07/2016  T:  05/07/2016  Job:  [id number]

## 2016-05-07 NOTE — Progress Notes (Signed)
Assisted Dr. Hodierne with right, ultrasound guided, interscalene  block. Side rails up, monitors on throughout procedure. See vital signs in flow sheet. Tolerated Procedure well. 

## 2016-05-07 NOTE — Anesthesia Postprocedure Evaluation (Signed)
Anesthesia Post Note  Patient: [name]  Procedure(s) Performed: Procedure(s) (LRB): OPEN REDUCTION INTERNAL FIXATION (ORIF) RIGHT DISTAL RADIAL FRACTURE (Right) RIGHT CARPAL TUNNEL RELEASE (Right)  Patient location during evaluation: PACU Anesthesia Type: General Level of consciousness: awake and alert and patient cooperative Pain management: pain level controlled Vital Signs Assessment: post-procedure vital signs reviewed and stable Respiratory status: spontaneous breathing and respiratory function stable Cardiovascular status: stable Anesthetic complications: no    Last Vitals:  Filed Vitals:   05/07/16 0942 05/07/16 1005  BP: 122/90 126/79  Pulse: 85 83  Temp:  36.6 C  Resp: 27 16    Last Pain:  Filed Vitals:   05/07/16 1010  PainSc: 0-No pain                 Ajeenah Heiny S

## 2016-05-08 ENCOUNTER — Encounter (HOSPITAL_BASED_OUTPATIENT_CLINIC_OR_DEPARTMENT_OTHER): Payer: Self-pay | Admitting: Orthopedic Surgery

## 2017-04-30 ENCOUNTER — Emergency Department (HOSPITAL_BASED_OUTPATIENT_CLINIC_OR_DEPARTMENT_OTHER)
Admission: EM | Admit: 2017-04-30 | Discharge: 2017-04-30 | Disposition: A | Discharge status: Home / Self Care | Payer: Self-pay | Attending: Emergency Medicine | Admitting: Emergency Medicine

## 2017-04-30 ENCOUNTER — Encounter (HOSPITAL_BASED_OUTPATIENT_CLINIC_OR_DEPARTMENT_OTHER): Payer: Self-pay | Admitting: Emergency Medicine

## 2017-04-30 ENCOUNTER — Emergency Department (HOSPITAL_BASED_OUTPATIENT_CLINIC_OR_DEPARTMENT_OTHER): Payer: Self-pay

## 2017-04-30 DIAGNOSIS — Y990 Civilian activity done for income or pay: Secondary | ICD-10-CM | POA: Insufficient documentation

## 2017-04-30 DIAGNOSIS — Y9389 Activity, other specified: Secondary | ICD-10-CM | POA: Insufficient documentation

## 2017-04-30 DIAGNOSIS — Y929 Unspecified place or not applicable: Secondary | ICD-10-CM | POA: Insufficient documentation

## 2017-04-30 DIAGNOSIS — S92425A Nondisplaced fracture of distal phalanx of left great toe, initial encounter for closed fracture: Secondary | ICD-10-CM | POA: Insufficient documentation

## 2017-04-30 DIAGNOSIS — W208XXA Other cause of strike by thrown, projected or falling object, initial encounter: Secondary | ICD-10-CM | POA: Insufficient documentation

## 2017-04-30 DIAGNOSIS — S92902A Unspecified fracture of left foot, initial encounter for closed fracture: Secondary | ICD-10-CM

## 2017-04-30 MED ORDER — IBUPROFEN 800 MG PO TABS
ORAL_TABLET | ORAL | Status: AC
  Filled 2017-04-30: qty 1

## 2017-04-30 MED ORDER — IBUPROFEN 800 MG PO TABS: 800.0000 mg | ORAL_TABLET | Freq: Three times a day (TID) | ORAL | 0 refills | Status: DC | PRN

## 2017-04-30 MED ORDER — IBUPROFEN 800 MG PO TABS
800.0000 mg | ORAL_TABLET | Freq: Once | ORAL | Status: AC
  Administered 2017-04-30: 800 mg via ORAL

## 2017-04-30 NOTE — ED Triage Notes (Signed)
Patient reports that he had a palate fall onto his left big toe.

## 2017-04-30 NOTE — ED Provider Notes (Signed)
Emergency Department Provider Note   I have reviewed the triage vital signs and the nursing notes.   HISTORY  Chief Complaint Toe Injury   HPI [name] is a 51 y.o. male with PMH of EtOH abuse presents to the emergency department for evaluation of left ear pain. Patient was at work and moving a pallet when it fell on his left big toe. Patient had severe pain in the toe with some swelling. Worse with walking or movement. No bleeding or obvious deformity to the toe. No injury to other parts of the leg. No alleviating factors. Pain is constant and moderately severe.  Past Medical History:  Diagnosis Date  . Distal radius fracture, right   . ETOH abuse     There are no active problems to display for this patient.   Past Surgical History:  Procedure Laterality Date  . ANKLE SURGERY Right 2007  . CARPAL TUNNEL RELEASE Right 05/07/2016   Procedure: RIGHT CARPAL TUNNEL RELEASE;  Surgeon: Dairl Ponder, MD;  Location: [facility];  Service: Orthopedics;  Laterality: Right;  . KNEE ARTHROSCOPY Right   . OPEN REDUCTION INTERNAL FIXATION (ORIF) DISTAL RADIAL FRACTURE Right 05/07/2016   Procedure: OPEN REDUCTION INTERNAL FIXATION (ORIF) RIGHT DISTAL RADIAL FRACTURE;  Surgeon: Dairl Ponder, MD;  Location: [facility];  Service: Orthopedics;  Laterality: Right;    Current Outpatient Rx  . Order #: [id number] Class: Historical Med  . Order #: [id number] Class: Print  . Order #: [id number] Class: Historical Med  . Order #: [id number] Class: Print  . Order #: [id number] Class: Print    Allergies Patient has no known allergies.  Family History  Problem Relation Age of Onset  . Hypertension Mother     Social History Social History  Substance Use Topics  . Smoking status: Never Smoker  . Smokeless tobacco: Never Used  . Alcohol use 0.0 oz/week     Comment: daily beer and liquor    Review of Systems  Constitutional: No fever/chills Eyes: No  visual changes. ENT: No sore throat. Cardiovascular: Denies chest pain. Respiratory: Denies shortness of breath. Gastrointestinal: No abdominal pain.  No nausea, no vomiting.  No diarrhea.  No constipation. Genitourinary: Negative for dysuria. Musculoskeletal: Negative for back pain. + toe pain.  Skin: Negative for rash. Neurological: Negative for headaches, focal weakness or numbness.  10-point ROS otherwise negative.  ____________________________________________   PHYSICAL EXAM:  VITAL SIGNS: ED Triage Vitals  Enc Vitals Group     BP 04/30/17 1612 133/83     Pulse Rate 04/30/17 1612 85     Resp 04/30/17 1612 18     Temp 04/30/17 1612 98.6 F (37 C)     Temp Source 04/30/17 1612 Oral     SpO2 04/30/17 1612 100 %     Weight 04/30/17 1612 194 lb (88 kg)     Height 04/30/17 1612 5\' 7"  (1.702 m)     Pain Score 04/30/17 1611 7   Constitutional: Alert and oriented. Well appearing and in no acute distress. Eyes: Conjunctivae are normal.  Head: Atraumatic. Nose: No congestion/rhinnorhea. Mouth/Throat: Mucous membranes are moist.  Neck: No stridor. Cardiovascular: Normal rate, regular rhythm. Good peripheral circulation. Grossly normal heart sounds.   Respiratory: Normal respiratory effort.  No retractions. Lungs CTAB. Musculoskeletal: No lower extremity edema.  Positive tenderness to palpation of the left great toe. No subungual hematoma or laceration. No toe deformity. No gross deformities of extremities. Neurologic:  Normal speech and language. No gross focal neurologic  deficits are appreciated.  Skin:  Skin is warm, dry and intact. No rash noted. Psychiatric: Mood and affect are normal. Speech and behavior are normal.  ____________________________________________  RADIOLOGY  Dg Foot Complete Left  Result Date: 04/30/2017 CLINICAL DATA:  Left first toe pain. Pallet fell on toe today while on the job. EXAM: LEFT FOOT - COMPLETE 3+ VIEW COMPARISON:  None. FINDINGS:  Nondisplaced mildly comminuted distal tuft fracture of the great toe. No intra-articular extension. No additional fracture of the foot. Alignment is maintained. IMPRESSION: Nondisplaced mildly comminuted distal tuft fracture of the great toe. Electronically Signed   By: Rubye OaksMelanie  Ehinger M.D.   On: 04/30/2017 17:15    ____________________________________________   PROCEDURES  Procedure(s) performed:   Procedures  None ____________________________________________   INITIAL IMPRESSION / ASSESSMENT AND PLAN / ED COURSE  Pertinent labs & imaging results that were available during my care of the patient were reviewed by me and considered in my medical decision making (see chart for details).  Patient presents to the emergency department for evaluation of left toe pain. He has a nondisplaced distal tuft fracture of the great toe on the left. Not open. Plan for hard-sole supportive shoes, NSAIDs at home, and work note. Will follow with Podiatry PRN.   At this time, I do not feel there is any life-threatening condition present. I have reviewed and discussed all results (EKG, imaging, lab, urine as appropriate), exam findings with patient. I have reviewed nursing notes and appropriate previous records.  I feel the patient is safe to be discharged home without further emergent workup. Discussed usual and customary return precautions. Patient and family (if present) verbalize understanding and are comfortable with this plan.  Patient will follow-up with their primary care provider. If they do not have a primary care provider, information for follow-up has been provided to them. All questions have been answered.  ____________________________________________  FINAL CLINICAL IMPRESSION(S) / ED DIAGNOSES  Final diagnoses:  Closed fracture of left foot, initial encounter     MEDICATIONS GIVEN DURING THIS VISIT:  Medications  ibuprofen (ADVIL,MOTRIN) tablet 800 mg (800 mg Oral Given 04/30/17 1801)       NEW OUTPATIENT MEDICATIONS STARTED DURING THIS VISIT:  None   Note:  This document was prepared using Dragon voice recognition software and may include unintentional dictation errors.  Alona BeneJoshua Oluwanifemi Susman, MD Emergency Medicine   Dorian Renfro, Arlyss RepressJoshua G, MD 05/01/17 [phone]

## 2017-04-30 NOTE — ED Notes (Signed)
UDS & BAT completed non-dot

## 2017-04-30 NOTE — Discharge Instructions (Signed)
You were seen in the ED with a left toe fracture. Wear hard sole shoes and follow up with podiatry. Use Motrin and Tylenol for pain.   Return to the ED with any new injuries or pain.

## 2017-04-30 NOTE — ED Notes (Signed)
Called Pts work place [phone] and spoke with [place] Nickeleresa Goins and stated Pt needed a non-dot BAT & UDS for work comp.

## 2017-09-22 ENCOUNTER — Emergency Department (HOSPITAL_COMMUNITY)
Admission: EM | Admit: 2017-09-22 | Discharge: 2017-09-22 | Disposition: A | Discharge status: Home / Self Care | Payer: Self-pay | Attending: Emergency Medicine | Admitting: Emergency Medicine

## 2017-09-22 ENCOUNTER — Encounter (HOSPITAL_COMMUNITY): Payer: Self-pay | Admitting: Emergency Medicine

## 2017-09-22 ENCOUNTER — Emergency Department (HOSPITAL_COMMUNITY): Payer: Self-pay

## 2017-09-22 DIAGNOSIS — Y92002 Bathroom of unspecified non-institutional (private) residence single-family (private) house as the place of occurrence of the external cause: Secondary | ICD-10-CM | POA: Insufficient documentation

## 2017-09-22 DIAGNOSIS — S62651A Nondisplaced fracture of medial phalanx of left index finger, initial encounter for closed fracture: Secondary | ICD-10-CM | POA: Insufficient documentation

## 2017-09-22 DIAGNOSIS — Y999 Unspecified external cause status: Secondary | ICD-10-CM | POA: Insufficient documentation

## 2017-09-22 DIAGNOSIS — S02401A Maxillary fracture, unspecified, initial encounter for closed fracture: Secondary | ICD-10-CM

## 2017-09-22 DIAGNOSIS — Y93E1 Activity, personal bathing and showering: Secondary | ICD-10-CM | POA: Insufficient documentation

## 2017-09-22 DIAGNOSIS — S0230XA Fracture of orbital floor, unspecified side, initial encounter for closed fracture: Secondary | ICD-10-CM

## 2017-09-22 DIAGNOSIS — S02402A Zygomatic fracture, unspecified, initial encounter for closed fracture: Secondary | ICD-10-CM | POA: Insufficient documentation

## 2017-09-22 DIAGNOSIS — S0280XA Fracture of other specified skull and facial bones, unspecified side, initial encounter for closed fracture: Secondary | ICD-10-CM

## 2017-09-22 MED ORDER — OXYCODONE-ACETAMINOPHEN 5-325 MG PO TABS
1.0000 | ORAL_TABLET | Freq: Once | ORAL | Status: AC
  Administered 2017-09-22: 1 via ORAL

## 2017-09-22 MED ORDER — OXYCODONE-ACETAMINOPHEN 5-325 MG PO TABS
1.0000 | ORAL_TABLET | Freq: Once | ORAL | Status: AC
  Administered 2017-09-22: 1 via ORAL
  Filled 2017-09-22: qty 1

## 2017-09-22 MED ORDER — OXYCODONE-ACETAMINOPHEN 5-325 MG PO TABS: 1.0000 | ORAL_TABLET | ORAL | 0 refills | Status: DC | PRN

## 2017-09-22 MED ORDER — FLUORESCEIN SODIUM 1 MG OP STRP
1.0000 | ORAL_STRIP | Freq: Once | OPHTHALMIC | Status: AC
  Administered 2017-09-22: 1 via OPHTHALMIC
  Filled 2017-09-22: qty 1

## 2017-09-22 MED ORDER — TETRACAINE HCL 0.5 % OP SOLN
2.0000 [drp] | Freq: Once | OPHTHALMIC | Status: AC
  Administered 2017-09-22: 2 [drp] via OPHTHALMIC
  Filled 2017-09-22: qty 4

## 2017-09-22 MED ORDER — IBUPROFEN 800 MG PO TABS: 800.0000 mg | ORAL_TABLET | Freq: Three times a day (TID) | ORAL | 0 refills | Status: DC | PRN

## 2017-09-22 MED ORDER — OXYCODONE-ACETAMINOPHEN 5-325 MG PO TABS
ORAL_TABLET | ORAL | Status: AC
  Filled 2017-09-22: qty 1
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
## 2017-09-22 NOTE — ED Notes (Signed)
Patient transported to CT via wheelchair
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
## 2017-09-22 NOTE — ED Notes (Signed)
Dr. Mesner at bedside at this time.  

## 2017-09-22 NOTE — ED Notes (Signed)
Pt given narcotic pain medicine in triage. Advised of side effects and instructed to avoid driving for a minimum of four hours.  
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
## 2017-11-13 ENCOUNTER — Other Ambulatory Visit (HOSPITAL_COMMUNITY): Payer: Self-pay | Admitting: Research Study

## 2017-11-13 DIAGNOSIS — Z006 Encounter for examination for normal comparison and control in clinical research program: Secondary | ICD-10-CM

## 2017-11-18 ENCOUNTER — Ambulatory Visit (HOSPITAL_COMMUNITY)
Admission: RE | Admit: 2017-11-18 | Discharge: 2017-11-18 | Disposition: A | Discharge status: Home / Self Care | Payer: Self-pay | Source: Ambulatory Visit | Attending: Research Study | Admitting: Research Study

## 2017-11-18 ENCOUNTER — Other Ambulatory Visit (HOSPITAL_COMMUNITY): Payer: Self-pay | Admitting: Research Study

## 2017-11-18 DIAGNOSIS — Z006 Encounter for examination for normal comparison and control in clinical research program: Secondary | ICD-10-CM

## 2018-04-01 ENCOUNTER — Other Ambulatory Visit: Payer: Self-pay

## 2018-04-01 ENCOUNTER — Emergency Department (HOSPITAL_COMMUNITY)
Admission: EM | Admit: 2018-04-01 | Discharge: 2018-04-02 | Disposition: A | Discharge status: Home / Self Care | Payer: Self-pay | Attending: Emergency Medicine | Admitting: Emergency Medicine

## 2018-04-01 ENCOUNTER — Encounter (HOSPITAL_COMMUNITY): Payer: Self-pay

## 2018-04-01 DIAGNOSIS — R42 Dizziness and giddiness: Secondary | ICD-10-CM | POA: Insufficient documentation

## 2018-04-01 DIAGNOSIS — Z79899 Other long term (current) drug therapy: Secondary | ICD-10-CM | POA: Insufficient documentation

## 2018-04-01 LAB — BASIC METABOLIC PANEL
Anion gap: 8 (ref 5–15)
BUN: 19 mg/dL (ref 6–20)
CHLORIDE: 107 mmol/L (ref 101–111)
CO2: 22 mmol/L (ref 22–32)
CREATININE: 1.11 mg/dL (ref 0.61–1.24)
Calcium: 9.3 mg/dL (ref 8.9–10.3)
GFR calc Af Amer: >60 mL/min (ref >60)
GFR calc non Af Amer: >60 mL/min (ref >60)
GLUCOSE: 100 mg/dL — AB (ref 65–99)
Potassium: 3.9 mmol/L (ref 3.5–5.1)
Sodium: 137 mmol/L (ref 135–145)

## 2018-04-01 LAB — CBC WITH DIFFERENTIAL/PLATELET
Basophils Absolute: 0 10*3/uL (ref 0.0–0.1)
Basophils Relative: 0 %
Eosinophils Absolute: 0.1 10*3/uL (ref 0.0–0.7)
Eosinophils Relative: 2 %
HEMATOCRIT: 42.4 % (ref 39.0–52.0)
HEMOGLOBIN: 14.2 g/dL (ref 13.0–17.0)
LYMPHS ABS: 1.9 10*3/uL (ref 0.7–4.0)
Lymphocytes Relative: 29 %
MCH: 29.1 pg (ref 26.0–34.0)
MCHC: 33.5 g/dL (ref 30.0–36.0)
MCV: 86.9 fL (ref 78.0–100.0)
Monocytes Absolute: 0.4 10*3/uL (ref 0.1–1.0)
Monocytes Relative: 6 %
NEUTROS ABS: 4.2 10*3/uL (ref 1.7–7.7)
NEUTROS PCT: 63 %
Platelets: 205 10*3/uL (ref 150–400)
RBC: 4.88 MIL/uL (ref 4.22–5.81)
RDW: 12.8 % (ref 11.5–15.5)
WBC: 6.7 10*3/uL (ref 4.0–10.5)
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
## 2018-04-01 NOTE — ED Notes (Signed)
No answer for vital recheck 

## 2018-04-02 ENCOUNTER — Emergency Department (HOSPITAL_COMMUNITY): Payer: Self-pay

## 2018-04-02 MED ORDER — MECLIZINE HCL 25 MG PO TABS: 25.0000 mg | ORAL_TABLET | Freq: Three times a day (TID) | ORAL | 0 refills | Status: AC | PRN

## 2018-04-02 NOTE — ED Notes (Signed)
ED Provider at bedside. 
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
## 2018-04-02 NOTE — ED Notes (Signed)
Patient verbalizes understanding of discharge instructions. Opportunity for questioning and answers were provided. Armband removed by staff, pt discharged from ED. E signature not available.  

## 2018-04-02 NOTE — ED Notes (Signed)
Pt walking around nurses station, nad

## 2018-05-02 ENCOUNTER — Encounter (HOSPITAL_COMMUNITY): Payer: Self-pay | Admitting: Emergency Medicine

## 2018-05-02 ENCOUNTER — Ambulatory Visit (HOSPITAL_COMMUNITY)
Admission: EM | Admit: 2018-05-02 | Discharge: 2018-05-02 | Disposition: A | Discharge status: Home / Self Care | Payer: Self-pay | Attending: Family Medicine | Admitting: Family Medicine

## 2018-05-02 ENCOUNTER — Ambulatory Visit (INDEPENDENT_AMBULATORY_CARE_PROVIDER_SITE_OTHER): Payer: Self-pay

## 2018-05-02 DIAGNOSIS — S92354A Nondisplaced fracture of fifth metatarsal bone, right foot, initial encounter for closed fracture: Secondary | ICD-10-CM

## 2018-05-02 MED ORDER — MELOXICAM 15 MG PO TABS: 15.0000 mg | ORAL_TABLET | Freq: Every day | ORAL | 0 refills | Status: DC
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
## 2018-11-14 ENCOUNTER — Ambulatory Visit: Payer: Self-pay | Admitting: Emergency Medicine

## 2021-11-07 ENCOUNTER — Other Ambulatory Visit (HOSPITAL_COMMUNITY): Payer: Self-pay

## 2021-11-07 MED ORDER — METRONIDAZOLE 500 MG PO TABS
2000.0000 mg | ORAL_TABLET | Freq: Once | ORAL | 0 refills | Status: AC
  Filled 2021-11-07: qty 4, 1d supply, fill #0

## 2022-12-18 NOTE — Therapy (Incomplete)
OUTPATIENT PHYSICAL THERAPY SHOULDER EVALUATION   Patient Name: [name] MRN: [id number] DOB:11-15-1966, 56 y.o., male Today's Date: 12/20/2022  END OF SESSION:   Past Medical History:  Diagnosis Date   Distal radius fracture, right    ETOH abuse    Past Surgical History:  Procedure Laterality Date   ANKLE SURGERY Right 2007   CARPAL TUNNEL RELEASE Right 05/07/2016   Procedure: RIGHT CARPAL TUNNEL RELEASE;  Surgeon: Dairl Ponder, MD;  Location: [facility];  Service: Orthopedics;  Laterality: Right;   KNEE ARTHROSCOPY Right    OPEN REDUCTION INTERNAL FIXATION (ORIF) DISTAL RADIAL FRACTURE Right 05/07/2016   Procedure: OPEN REDUCTION INTERNAL FIXATION (ORIF) RIGHT DISTAL RADIAL FRACTURE;  Surgeon: Dairl Ponder, MD;  Location: [facility];  Service: Orthopedics;  Laterality: Right;   There are no problems to display for this patient.   PCP: Lavinia Sharps, NP  REFERRING PROVIDER: Quita Skye, PA-C  REFERRING DIAG: [phone] (ICD-10-CM) - Pain in left shoulder  THERAPY DIAG:  No diagnosis found.  Rationale for Evaluation and Treatment: Rehabilitation  ONSET DATE: ***  SUBJECTIVE:                                                                                                                                                                                      SUBJECTIVE STATEMENT: ***  PERTINENT HISTORY: Prior radial ORIF and carpal tunnel release  PAIN:  Are you having pain: *** Location: *** How would you describe your pain? *** Best in past week: *** Worst in past week: *** Aggravating factors: *** Easing factors: ***   PRECAUTIONS: None  WEIGHT BEARING RESTRICTIONS: No  FALLS:  Has patient fallen in last 6 months? {fallsyesno:27318}  LIVING ENVIRONMENT: Lives with: {[facility] lives with:25569::"lives with their family"} Lives in: {Lives in:25570} Stairs: {opstairs:27293} Has following equipment at home:  {Assistive devices:23999}  OCCUPATION: ***  PLOF: Independent  PATIENT GOALS:***  NEXT MD VISIT:   OBJECTIVE:   DIAGNOSTIC FINDINGS:  No recent imaging per epic or pt  PATIENT SURVEYS:  {rehab surveys:24030:a}  COGNITION: Overall cognitive status: Within functional limits for tasks assessed     SENSATION: Light touch intact B UE ***   POSTURE: ***  UPPER EXTREMITY ROM:  A/PROM Right eval Left eval  Shoulder flexion    Shoulder abduction    Shoulder internal rotation    Shoulder external rotation    Elbow flexion    Elbow extension    Wrist flexion    Wrist extension     (Blank rows = not tested) (Key: WNL = within normal limits, * = concordant pain, s = stiffness/stretching sensation, NT = not  tested) Comments:   UPPER EXTREMITY MMT:  MMT Right eval Left eval  Shoulder flexion    Shoulder extension    Shoulder abduction    Shoulder extension    Shoulder internal rotation    Shoulder external rotation    Elbow flexion    Elbow extension    Grip strength     (Blank rows = not tested)  (Key: WFL = within functional limits not formally assessed, * = concordant pain, s = stiffness/stretching sensation, NT = not tested)  Comments:   SHOULDER SPECIAL TESTS: Impingement tests: {shoulder impingement test:25231:a} SLAP lesions: {SLAP lesions:25232} Instability tests: {shoulder instability test:25233} Rotator cuff assessment: {rotator cuff assessment:25234} Biceps assessment: {biceps assessment:25235}  JOINT MOBILITY TESTING:  ***  PALPATION:  ***   TODAY'S TREATMENT:                                                                                                                                        [facility] Adult PT Treatment:                                                DATE: 12/20/22 Therapeutic Exercise: *** Manual Therapy: *** Self Care: ***  PATIENT EDUCATION: Education details: Pt education on PT impairments, prognosis, and POC. Informed  consent. Rationale for interventions, safe/appropriate HEP performance Person educated: Patient Education method: Explanation, Demonstration, Tactile cues, Verbal cues, and Handouts Education comprehension: verbalized understanding, returned demonstration, verbal cues required, tactile cues required, and needs further education    HOME EXERCISE PROGRAM: ***  ASSESSMENT:  CLINICAL IMPRESSION: Patient is a 56 y.o. gentleman who was seen today for physical therapy evaluation and treatment for chronic L shoulder pain.   OBJECTIVE IMPAIRMENTS: {opptimpairments:25111}.   ACTIVITY LIMITATIONS: {activitylimitations:27494}  PARTICIPATION LIMITATIONS: {participationrestrictions:25113}  PERSONAL FACTORS: {Personal factors:25162} are also affecting patient's functional outcome.   REHAB POTENTIAL: {rehabpotential:25112}  CLINICAL DECISION MAKING: {clinical decision making:25114}  EVALUATION COMPLEXITY: {Evaluation complexity:25115}   GOALS: Goals reviewed with patient? {yes/no:20286}  SHORT TERM GOALS: Target date: ***  Pt will score greater than or equal to *** on FOTO in order to demonstrate improved perception of function due to symptoms.  Baseline: ***  Goal status: INITIAL   2. Pt will demonstrate appropriate understanding and performance of initially prescribed HEP in order to facilitate improved independence with management of symptoms.  Baseline: HEP provided on eval Goal status: INITIAL   LONG TERM GOALS: Target date: ***  Pt will score *** on FOTO in order to demonstrate improved perception of function due to symptoms. Baseline: *** Goal status: INITIAL  2.  Pt will demonstrate at least *** degrees of active shoulder elevation in order to demonstrate improved tolerance to functional movement patterns such as reaching overhead and upper body dressing.  Baseline:  see ROM chart above Goal status: INITIAL  3.  Pt will demonstrate at least 4+/5 shoulder *** MMT for improved  symmetry of UE strength and improved tolerance to functional movements.  Baseline: see MMT chart above Goal status: INITIAL  4. Pt will report/demonstrate ability to perform *** with less than 2 point increase in pain on NPS in order to indicative improved tolerance/independence with functional tasks/ADLs  Baseline: ***  Goal status: INITIAL   PLAN:  PT FREQUENCY: {rehab frequency:25116}  PT DURATION: {rehab duration:25117}  PLANNED INTERVENTIONS: {rehab planned interventions:25118::"Therapeutic exercises","Therapeutic activity","Neuromuscular re-education","Balance training","Gait training","Patient/Family education","Self Care","Joint mobilization"}  PLAN FOR NEXT SESSION: Progress ROM/strengthening exercises as able/appropriate, review HEP.    Ashley Murrain PT, DPT 12/20/2022 10:20 AM

## 2022-12-20 ENCOUNTER — Ambulatory Visit: Payer: Self-pay | Admitting: Physical Therapy

## 2023-03-15 ENCOUNTER — Other Ambulatory Visit: Payer: Self-pay

## 2023-03-15 ENCOUNTER — Emergency Department (HOSPITAL_COMMUNITY)
Admission: EM | Admit: 2023-03-15 | Discharge: 2023-03-15 | Disposition: A | Discharge status: Home / Self Care | Payer: Medicaid Other | Attending: Emergency Medicine | Admitting: Emergency Medicine

## 2023-03-15 ENCOUNTER — Encounter (HOSPITAL_COMMUNITY): Payer: Self-pay

## 2023-03-15 DIAGNOSIS — T6591XA Toxic effect of unspecified substance, accidental (unintentional), initial encounter: Secondary | ICD-10-CM

## 2023-03-15 DIAGNOSIS — T40691A Poisoning by other narcotics, accidental (unintentional), initial encounter: Secondary | ICD-10-CM | POA: Diagnosis present

## 2023-03-15 NOTE — ED Provider Notes (Signed)
[facility] Provider Note   CSN: [id number] Arrival date & time: 03/15/23  2017     History Chief Complaint  Patient presents with   Poisoning    HPI [name] is a 57 y.o. male presenting for chief complaint of accidental overdose.  He is an otherwise healthy 57 year old male who states that he took a Subutex that was given to him by a friend not knowing what the dosage was.  He took it approximately an hour ago and feels like he can barely stay awake.  He does not take medication on a normal day it was a 8 mg Subutex fortunately mixed with 2 mg of Narcan.  He denies fevers chills nausea vomiting shortness of breath.  Just feels somnolent at this time.   Patient's recorded medical, surgical, social, medication list and allergies were reviewed in the Snapshot window as part of the initial history.   Review of Systems   Review of Systems  Constitutional:  Negative for chills and fever.  HENT:  Negative for ear pain and sore throat.   Eyes:  Negative for pain and visual disturbance.  Respiratory:  Negative for cough and shortness of breath.   Cardiovascular:  Negative for chest pain and palpitations.  Gastrointestinal:  Negative for abdominal pain and vomiting.  Genitourinary:  Negative for dysuria and hematuria.  Musculoskeletal:  Negative for arthralgias and back pain.  Skin:  Negative for color change and rash.  Neurological:  Negative for seizures and syncope.  All other systems reviewed and are negative.   Physical Exam Updated Vital Signs BP (!) 147/93 (BP Location: Right Arm)   Pulse 67   Temp 97.7 F (36.5 C) (Oral)   Resp 18   Ht 5\' 7"  (1.702 m)   Wt 83.9 kg   SpO2 94%   BMI 28.98 kg/m  Physical Exam Vitals and nursing note reviewed.  Constitutional:      General: He is not in acute distress.    Appearance: He is well-developed.  HENT:     Head: Normocephalic and atraumatic.  Eyes:      Conjunctiva/sclera: Conjunctivae normal.  Cardiovascular:     Rate and Rhythm: Normal rate and regular rhythm.     Heart sounds: No murmur heard. Pulmonary:     Effort: Pulmonary effort is normal. No respiratory distress.     Breath sounds: Normal breath sounds.  Abdominal:     Palpations: Abdomen is soft.     Tenderness: There is no abdominal tenderness.  Musculoskeletal:        General: No swelling.     Cervical back: Neck supple.  Skin:    General: Skin is warm and dry.     Capillary Refill: Capillary refill takes less than 2 seconds.  Neurological:     Mental Status: He is alert.  Psychiatric:        Mood and Affect: Mood normal.      ED Course/ Medical Decision Making/ A&P    Procedures Procedures   Medications Ordered in ED Medications - No data to display  Medical Decision Making:    [name] is a 57 y.o. male who presented to the ED today with intoxication of Suboxone.  He took it approximately 2 hours prior to arrival.  He states he is already starting to feel more awake but was worried about an accidental overdose based on how sleepy was earlier tonight. Agree with patient, to high-dose of the  Subutex that he took.  Will plan to observe the patient until clinical improvement. Reassessment: Observed until intoxication improved.  After observation, patient is now ambulatory tolerating p.o. intake.  He states he feels comfortable with discharge and will call a friend to take him home.  Patient states he would like to follow-up with a chronic pain provider for long-term management.  Local resources provided in patient's chart.  No acute indication for further intervention at this time given clinical sobriety and improvement. Disposition:  I have considered need for hospitalization, however, considering all of the above, I believe this patient is stable for discharge at this time.  Patient/family educated about specific return precautions for given chief  complaint and symptoms.  Patient/family educated about follow-up with PCP.     Patient/family expressed understanding of return precautions and need for follow-up. Patient spoken to regarding all imaging and laboratory results and appropriate follow up for these results. All education provided in verbal form with additional information in written form. Time was allowed for answering of patient questions. Patient discharged.    Emergency Department Medication Summary:   Medications - No data to display       Clinical Impression:  1. Accidental ingestion of substance, initial encounter      Discharge   Final Clinical Impression(s) / ED Diagnoses Final diagnoses:  Accidental ingestion of substance, initial encounter    Rx / DC Orders ED Discharge Orders     None         Tretha Sciara, MD 03/15/23 2335

## 2023-03-15 NOTE — ED Triage Notes (Signed)
Patient arrives via ems from home secondary to ingestion of suboxone from a friend to help with chronic hip pain. Patient c/o feeling fatigue and very sleepy. Patient wants to be checked out.

## 2023-06-14 NOTE — Therapy (Deleted)
OUTPATIENT PHYSICAL THERAPY THORACOLUMBAR EVALUATION   Patient Name: [name] MRN: [id number] DOB:[birth year], 57 y.o., male Today's Date: 06/14/2023  END OF SESSION:   Past Medical History:  Diagnosis Date   Distal radius fracture, right    ETOH abuse    Past Surgical History:  Procedure Laterality Date   ANKLE SURGERY Right 2007   CARPAL TUNNEL RELEASE Right 05/07/2016   Procedure: RIGHT CARPAL TUNNEL RELEASE;  Surgeon: Dairl Ponder, MD;  Location: [facility];  Service: Orthopedics;  Laterality: Right;   KNEE ARTHROSCOPY Right    OPEN REDUCTION INTERNAL FIXATION (ORIF) DISTAL RADIAL FRACTURE Right 05/07/2016   Procedure: OPEN REDUCTION INTERNAL FIXATION (ORIF) RIGHT DISTAL RADIAL FRACTURE;  Surgeon: Dairl Ponder, MD;  Location: [facility];  Service: Orthopedics;  Laterality: Right;   There are no problems to display for this patient.   PCP: Pcp, No   REFERRING PROVIDER: Meccariello, Solmon Ice, MD  REFERRING DIAG: lumbago with B/L radiculopathy  Rationale for Evaluation and Treatment: Rehabilitation  THERAPY DIAG:  No diagnosis found.  ONSET DATE: chronic, several months ago  SUBJECTIVE:                                                                                                                                                                                           SUBJECTIVE STATEMENT: ***  PERTINENT HISTORY:  Pt says he is worried he has inflammation. Hip pain started "a few months ago". Both sides of low back radiating down. At one point was in hamstrings but not anymore. Feels that the cold was what triggered his pain. He could not stand up straight. Leg workouts (lifts) have also made pain worse. He has been taking glucosamine, "flexi all", and meloxicam which have helped. Water massage at [employer] also helped.   PAIN:  Are you having pain? {OPRCPAIN:27236}  PRECAUTIONS: None  WEIGHT BEARING RESTRICTIONS:  No  FALLS:  Has patient fallen in last 6 months? No  OCCUPATION: ***  PLOF: Independent  PATIENT GOALS: To reduce and manage my back pain  NEXT MD VISIT: as needed  OBJECTIVE:   DIAGNOSTIC FINDINGS:  none  PATIENT SURVEYS:  FOTO ***  SCREENING FOR RED FLAGS: Bowel or bladder incontinence: {Yes/No:304960894}  MUSCLE LENGTH: Hamstrings: Right *** deg; Left *** deg Thomas test: Right *** deg; Left *** deg  POSTURE: {posture:25561}  PALPATION: ***  LUMBAR ROM:   AROM eval  Flexion   Extension   Right lateral flexion   Left lateral flexion   Right rotation   Left rotation    (Blank rows = not tested)  LOWER EXTREMITY ROM:     {  AROM/PROM:27142}  Right eval Left eval  Hip flexion    Hip extension    Hip abduction    Hip adduction    Hip internal rotation    Hip external rotation    Knee flexion    Knee extension    Ankle dorsiflexion    Ankle plantarflexion    Ankle inversion    Ankle eversion     (Blank rows = not tested)  LOWER EXTREMITY MMT:    MMT Right eval Left eval  Hip flexion    Hip extension    Hip abduction    Hip adduction    Hip internal rotation    Hip external rotation    Knee flexion    Knee extension    Ankle dorsiflexion    Ankle plantarflexion    Ankle inversion    Ankle eversion     (Blank rows = not tested)  LUMBAR SPECIAL TESTS:  Straight leg raise test: {pos/neg:25243}, Slump test: {pos/neg:25243}, SI Compression/distraction test: {pos/neg:25243}, and FABER test: {pos/neg:25243}  FUNCTIONAL TESTS:  5 times sit to stand: *** 30 seconds chair stand test  GAIT: Distance walked: 56ft x2 Assistive device utilized: None Level of assistance: Complete Independence Comments: ***  TODAY'S TREATMENT:                                                                                                                              DATE: 06/17/23 Eval    PATIENT EDUCATION:  Education details: Discussed eval findings, rehab  rationale and POC and patient is in agreement  Person educated: Patient Education method: Explanation Education comprehension: verbalized understanding and needs further education  HOME EXERCISE PROGRAM: ***  ASSESSMENT:  CLINICAL IMPRESSION: Patient is a *** y.o. *** who was seen today for physical therapy evaluation and treatment for ***.   OBJECTIVE IMPAIRMENTS: {opptimpairments:25111}.   ACTIVITY LIMITATIONS: {activitylimitations:27494}  PARTICIPATION LIMITATIONS: {participationrestrictions:25113}  PERSONAL FACTORS: {Personal factors:25162} are also affecting patient's functional outcome.   REHAB POTENTIAL: {rehabpotential:25112}  CLINICAL DECISION MAKING: {clinical decision making:25114}  EVALUATION COMPLEXITY: {Evaluation complexity:25115}   GOALS: Goals reviewed with patient? {yes/no:20286}  SHORT TERM GOALS: Target date: ***  Patient to demonstrate independence in HEP  Baseline: Goal status: {GOALSTATUS:25110}  2.  *** Baseline:  Goal status: {GOALSTATUS:25110}  3.  *** Baseline:  Goal status: {GOALSTATUS:25110}  4.  *** Baseline:  Goal status: {GOALSTATUS:25110}  5.  *** Baseline:  Goal status: {GOALSTATUS:25110}  6.  *** Baseline:  Goal status: {GOALSTATUS:25110}  LONG TERM GOALS: Target date: ***  *** Baseline:  Goal status: {GOALSTATUS:25110}  2.  *** Baseline:  Goal status: {GOALSTATUS:25110}  3.  *** Baseline:  Goal status: {GOALSTATUS:25110}  4.  *** Baseline:  Goal status: {GOALSTATUS:25110}  5.  *** Baseline:  Goal status: {GOALSTATUS:25110}  6.  *** Baseline:  Goal status: {GOALSTATUS:25110}  PLAN:  PT FREQUENCY: 1-2x/week  PT DURATION: 6 weeks  PLANNED INTERVENTIONS: Therapeutic exercises, Therapeutic activity, Neuromuscular re-education, Balance training, Gait  training, Patient/Family education, Self Care, Joint mobilization, Dry Needling, Electrical stimulation, Spinal mobilization, Cryotherapy, Moist heat,  Manual therapy, and Re-evaluation.  PLAN FOR NEXT SESSION: HEP review and update, manual techniques as appropriate, aerobic tasks, ROM and flexibility activities, strengthening and PREs, TPDN, gait and balance training as needed     Hildred Laser, PT 06/14/2023, 11:39 AM

## 2023-06-17 ENCOUNTER — Ambulatory Visit: Payer: Commercial Managed Care - HMO

## 2023-06-18 ENCOUNTER — Ambulatory Visit: Payer: Commercial Managed Care - HMO | Admitting: Physical Therapy

## 2023-07-01 NOTE — Therapy (Signed)
OUTPATIENT PHYSICAL THERAPY EVALUATION   Patient Name: [name] MRN: [id number] DOB:Jul 15, 1966, 57 y.o., male Today's Date: 07/02/2023   END OF SESSION:  PT End of Session - 07/02/23 0903     Visit Number 1    Number of Visits 9    Date for PT Re-Evaluation 08/27/23    Authorization Type Fredrik Rigger MCD    PT Start Time [phone]    PT Stop Time 0930    PT Time Calculation (min) 43 min    Activity Tolerance Patient tolerated treatment well    Behavior During Therapy [facility] for tasks assessed/performed             Past Medical History:  Diagnosis Date   Distal radius fracture, right    ETOH abuse    Past Surgical History:  Procedure Laterality Date   ANKLE SURGERY Right 2007   CARPAL TUNNEL RELEASE Right 05/07/2016   Procedure: RIGHT CARPAL TUNNEL RELEASE;  Surgeon: Dairl Ponder, MD;  Location: [facility];  Service: Orthopedics;  Laterality: Right;   KNEE ARTHROSCOPY Right    OPEN REDUCTION INTERNAL FIXATION (ORIF) DISTAL RADIAL FRACTURE Right 05/07/2016   Procedure: OPEN REDUCTION INTERNAL FIXATION (ORIF) RIGHT DISTAL RADIAL FRACTURE;  Surgeon: Dairl Ponder, MD;  Location: [facility];  Service: Orthopedics;  Laterality: Right;   There are no problems to display for this patient.   PCP: None  REFERRING PROVIDER: Unknown Jim, MD  REFERRING DIAG: lumbago with B/L radiculopathy  Rationale for Evaluation and Treatment: Rehabilitation  THERAPY DIAG:  Other low back pain  Muscle weakness (generalized)  ONSET DATE: worsened 5-6 months ago   SUBJECTIVE:                                                                                                                                                                                          SUBJECTIVE STATEMENT: Patient reports L4-5 vertebra out of place that has been causing him excruciating pain. He has been using creams on his lower back and one of his friends gave  him some muscle relaxer for the pain. States he has some good days and bad days. He remains active and does a lot, but he does have help if he has to do something heavy. He reports he has been having back pain for a while, but recently got worse 5-6 months ago. He will get pain in his lower hips and back of his legs occasionally. He does go to the gym and has to change the ways he does his sit ups but he does work his core. He reports he does some stretches  where he arches back as much as he can.   PERTINENT HISTORY:  See PMH above  PAIN:  Are you having pain? Yes:  NPRS scale: 4/10 (8/10 worse) Pain location: Lower back Pain description: Sharp Aggravating factors: Standing, walking, lifting, increase in activity Relieving factors: Arching backward, medication  PRECAUTIONS: None  WEIGHT BEARING RESTRICTIONS: No  FALLS:  Has patient fallen in last 6 months? No  PLOF: Independent  PATIENT GOALS: Pain relief and get back to work and exercise without limitation   OBJECTIVE:  PATIENT SURVEYS:  FOTO 42% functional status  SCREENING FOR RED FLAGS: Negative  COGNITION: Overall cognitive status: Within functional limits for tasks assessed     SENSATION: WFL  MUSCLE LENGTH: Limitations noted   POSTURE:   Rounded shoulder posture  PALPATION: Non-tender to palpation, lumbar CPAs grossly WFL and non-painful  LUMBAR ROM:   AROM eval  Flexion WFL  Extension WFL  Right lateral flexion WFL  Left lateral flexion WFL  Right rotation WFL  Left rotation WFL   (Blank rows = not tested)  LOWER EXTREMITY ROM:      LE ROM grossly WFL  LOWER EXTREMITY MMT:    MMT Right eval Left eval  Hip flexion 4 4  Hip extension 4- 4-  Hip abduction 4- 4-  Hip adduction    Hip internal rotation    Hip external rotation    Knee flexion 5 5  Knee extension 5 5  Ankle dorsiflexion    Ankle plantarflexion    Ankle inversion    Ankle eversion     (Blank rows = not tested)  LUMBAR  SPECIAL TESTS:  Lumbar radicular testing negative  FUNCTIONAL TESTS:  DLLT: 45 deg with onset of low back pain  GAIT: Assistive device utilized: None Level of assistance: Complete Independence Comments: WFL   TODAY'S TREATMENT:      [facility] Adult PT Treatment:                                                DATE: 07/02/2023 Therapeutic Exercise: Piriformis stretch x 20 sec each Bridge 5 x 10 sec hold Modified side plank 2 x 20 sec each Side clamshell with green x 15 each Prone press up x 10 Modified front plank 2 x 20 sec  PATIENT EDUCATION:  Education details: Exam findings, POC, HEP Person educated: Patient Education method: Explanation, Demonstration, Tactile cues, Verbal cues, and Handouts Education comprehension: verbalized understanding, returned demonstration, verbal cues required, tactile cues required, and needs further education  HOME EXERCISE PROGRAM: Access Code: [email or web address]    ASSESSMENT: CLINICAL IMPRESSION: Patient is a 56 y.o. male who was seen today for physical therapy evaluation and treatment for chronic lower back pain with occasional pain radiating into bilateral hips and posterior thigh. All radicular testing negative this visit and patient did not report any radiating pain. He exhibits good lumbar mobility without tenderness, but does demonstrate limitations in core and hip strength that is likely contributing to his symptoms. He does report a preference for extension based movements and was reporting that he does a lot of crunches at the gym so he was instructed to hold off and performing abdominal exercises at the gym and focus on HEP provided for core strengthening.   OBJECTIVE IMPAIRMENTS: decreased activity tolerance, decreased strength, impaired flexibility, postural dysfunction, and pain.   ACTIVITY LIMITATIONS: carrying,  lifting, bending, sitting, standing, squatting, and locomotion level  PARTICIPATION LIMITATIONS: meal prep, cleaning, shopping,  community activity, occupation, and yard work  PERSONAL FACTORS: Fitness, Past/current experiences, and Time since onset of injury/illness/exacerbation are also affecting patient's functional outcome.   REHAB POTENTIAL: Good  CLINICAL DECISION MAKING: Stable/uncomplicated  EVALUATION COMPLEXITY: Low   GOALS: Goals reviewed with patient? Yes  SHORT TERM GOALS: Target date: 07/30/2023  Patient will be I with initial HEP in order to progress with therapy. Baseline: HEP provided at eval Goal status: INITIAL  2.  Patient will report low back pain </= 4/10 with activity in order to reduce functional limitations Baseline: 8/10 pain with activity Goal status: INITIAL  LONG TERM GOALS: Target date: 08/27/2023  Patient will be I with final HEP to maintain progress from PT. Baseline: HEP provided at eval Goal status: INITIAL  2.  Patient will report >/= 60% status on FOTO to indicate improved functional ability. Baseline: 42% functional status Goal status: INITIAL  3.  Patient will exhibit DLLT </= 15 deg with good control and without onset of low back pain to indicate improved core control and activity tolerance Baseline: 45 deg Goal status: INITIAL  4.  Patient will exhibit hip strength >/= 4/5 MMT to allow for reduced pain with lifting and work related tasks Baseline: 4-/5 MMT Goal status: INITIAL  5. Patient will report no limitations with gym exercise due to low back pain so he can improve his general health and return to prior level of function  Baseline: patient limited with gym exercises due to low back pain  Goal status: INITIAL   PLAN: PT FREQUENCY: 1x/week  PT DURATION: 8 weeks  PLANNED INTERVENTIONS: Therapeutic exercises, Therapeutic activity, Neuromuscular re-education, Balance training, Gait training, Patient/Family education, Self Care, Joint mobilization, Joint manipulation, Aquatic Therapy, Dry Needling, Electrical stimulation, Spinal manipulation, Spinal  mobilization, Cryotherapy, Moist heat, Traction, Manual therapy, and Re-evaluation.  PLAN FOR NEXT SESSION: Review HEP and progress PRN, progress core stabilization and hip strengthening, lifting mechanics and progress lifting, extension based movements as needed   Rosana Hoes, PT, DPT, LAT, ATC 07/02/23  11:13 AM Phone: [phone] Fax: [phone]    [employer] Authorization   Choose one: Rehabilitative  Standardized Assessment or Functional Outcome Tool: See Pain Assessment, FOTO  Score or Percent Disability: Pain: 4/10 (8/10 at worse), FOTO: 42% functional status  Body Parts Treated (Select each separately):  Lumbopelvic. Overall deficits/functional limitations for body part selected: moderate  If treatment provided at initial evaluation, no treatment charged due to lack of authorization.

## 2023-07-02 ENCOUNTER — Ambulatory Visit: Payer: Commercial Managed Care - HMO | Attending: Family Medicine | Admitting: Physical Therapy

## 2023-07-02 ENCOUNTER — Encounter: Payer: Self-pay | Admitting: Physical Therapy

## 2023-07-02 ENCOUNTER — Other Ambulatory Visit: Payer: Self-pay

## 2023-07-02 DIAGNOSIS — M6281 Muscle weakness (generalized): Secondary | ICD-10-CM | POA: Diagnosis present

## 2023-07-02 DIAGNOSIS — M5459 Other low back pain: Secondary | ICD-10-CM | POA: Diagnosis present

## 2023-07-02 NOTE — Patient Instructions (Signed)
Access Code: [email or web address] URL: [email or web address] Date: 07/02/2023 Prepared by: Rosana Hoes  Exercises - Supine Piriformis Stretch with Foot on Ground  - 1 x daily - 3 reps - 30 seconds hold - Supine Bridge  - 1 x daily - 3 sets - 10 reps - 10 seconds hold - Side Plank on Knees  - 1 x daily - 3 reps - 20 seconds hold - Clamshell with Resistance  - 1 x daily - 3 sets - 10 reps - Prone Press Up  - 1 x daily - 3 sets - 10 reps - Plank on Knees  - 1 x daily - 3 reps - 20 seconds hold

## 2023-07-10 ENCOUNTER — Encounter: Payer: Self-pay | Admitting: Physical Therapy

## 2023-07-10 ENCOUNTER — Ambulatory Visit: Payer: Commercial Managed Care - HMO | Admitting: Physical Therapy

## 2023-07-10 ENCOUNTER — Other Ambulatory Visit: Payer: Self-pay

## 2023-07-10 DIAGNOSIS — M6281 Muscle weakness (generalized): Secondary | ICD-10-CM

## 2023-07-10 DIAGNOSIS — M5459 Other low back pain: Secondary | ICD-10-CM | POA: Diagnosis not present

## 2023-07-10 NOTE — Patient Instructions (Signed)
Access Code: [email or web address] URL: [email or web address] Date: 07/10/2023 Prepared by: Rosana Hoes  Exercises - Supine Piriformis Stretch with Foot on Ground  - 1 x daily - 3 reps - 30 seconds hold - Supine Bridge  - 1 x daily - 3 sets - 10 reps - 10 seconds hold - Side Plank on Knees  - 1 x daily - 3 reps - 20 seconds hold - Clamshell with Resistance  - 1 x daily - 3 sets - 10 reps - Prone Press Up  - 1 x daily - 3 sets - 10 reps - Plank on Knees  - 1 x daily - 3 reps - 20 seconds hold - Standing Anti-Rotation Press with Anchored Resistance  - 1 x daily - 3 sets - 10 reps

## 2023-07-10 NOTE — Therapy (Signed)
OUTPATIENT PHYSICAL THERAPY TREATMENT   Patient Name: [name] MRN: [id number] DOB:03-18-1966, 57 y.o., male Today's Date: 07/10/2023   END OF SESSION:  PT End of Session - 07/10/23 0851     Visit Number 2    Number of Visits 9    Date for PT Re-Evaluation 08/27/23    Authorization Type Rosann Auerbach / Rolene Arbour MCD    Authorization Time Period 07/08/2023 - 09/06/2023    Authorization - Visit Number 1    Authorization - Number of Visits 10    PT Start Time 0845    PT Stop Time 0925    PT Time Calculation (min) 40 min    Activity Tolerance Patient tolerated treatment well    Behavior During Therapy [facility] for tasks assessed/performed              Past Medical History:  Diagnosis Date   Distal radius fracture, right    ETOH abuse    Past Surgical History:  Procedure Laterality Date   ANKLE SURGERY Right 2007   CARPAL TUNNEL RELEASE Right 05/07/2016   Procedure: RIGHT CARPAL TUNNEL RELEASE;  Surgeon: Dairl Ponder, MD;  Location: [facility];  Service: Orthopedics;  Laterality: Right;   KNEE ARTHROSCOPY Right    OPEN REDUCTION INTERNAL FIXATION (ORIF) DISTAL RADIAL FRACTURE Right 05/07/2016   Procedure: OPEN REDUCTION INTERNAL FIXATION (ORIF) RIGHT DISTAL RADIAL FRACTURE;  Surgeon: Dairl Ponder, MD;  Location: [facility];  Service: Orthopedics;  Laterality: Right;   There are no problems to display for this patient.   PCP: None  REFERRING PROVIDER: Unknown Jim, MD  REFERRING DIAG: lumbago with B/L radiculopathy  Rationale for Evaluation and Treatment: Rehabilitation  THERAPY DIAG:  Other low back pain  Muscle weakness (generalized)  ONSET DATE: worsened 5-6 months ago   SUBJECTIVE:                                                                                                                                                                                          SUBJECTIVE STATEMENT: Patient reports he was in the  gym and picked up some weights and that caused back to be very painful. States he feels good today.   PAIN:  Are you having pain? Yes:  NPRS scale: 4/10 (8/10 worse) Pain location: Lower back Pain description: Sharp Aggravating factors: Standing, walking, lifting, increase in activity Relieving factors: Arching backward, medication  PERTINENT HISTORY: See PMH above  PRECAUTIONS: None  WEIGHT BEARING RESTRICTIONS: No  PATIENT GOALS: Pain relief and get back to work and exercise without limitation   OBJECTIVE:  PATIENT SURVEYS:  FOTO 42% functional  status  MUSCLE LENGTH: Limitations noted   POSTURE:   Rounded shoulder posture  LUMBAR ROM:   AROM eval  Flexion WFL  Extension WFL  Right lateral flexion WFL  Left lateral flexion WFL  Right rotation WFL  Left rotation WFL   (Blank rows = not tested)  LOWER EXTREMITY MMT:    MMT Right eval Left eval Rt / Lt 07/10/2023  Hip flexion 4 4   Hip extension 4- 4- 4- / 4-  Hip abduction 4- 4- 4- / 4-  Hip adduction     Hip internal rotation     Hip external rotation     Knee flexion 5 5   Knee extension 5 5   Ankle dorsiflexion     Ankle plantarflexion     Ankle inversion     Ankle eversion      (Blank rows = not tested)  FUNCTIONAL TESTS:  DLLT: 45 deg with onset of low back pain   TODAY'S TREATMENT:      [facility] Adult PT Treatment:                                                DATE: 07/10/2023 Therapeutic Exercise: Recumbent bike L3 x 5 min while taking subjective Prone press up 5 x 10 sec hold  Piriformis stretch 2 x 20 sec each LTR 5 x 5 sec each 90-90 alternating heel tap holding 10# KB to ceiling 2 x 20 Bridge 2 x 10 with 10 sec hold Modified side plank with clamshell with green 3 x 10 Modified front plank 3 x 30 sec Pallof press with FM 13# 2 x 10 each   [facility] Adult PT Treatment:                                                DATE: 07/02/2023 Therapeutic Exercise: Piriformis stretch x 20 sec  each Bridge 5 x 10 sec hold Modified side plank 2 x 20 sec each Side clamshell with green x 15 each Prone press up x 10 Modified front plank 2 x 20 sec  PATIENT EDUCATION:  Education details: HEP update Person educated: Patient Education method: Explanation, Demonstration, Tactile cues, Verbal cues, and Handouts Education comprehension: verbalized understanding, returned demonstration, verbal cues required, tactile cues required, and needs further education  HOME EXERCISE PROGRAM: Access Code: [email or web address]    ASSESSMENT: CLINICAL IMPRESSION: Patient tolerated therapy well with no adverse effects. Therapy focused on progressing core stabilization and hip strengthening with good tolerance. He was able to tolerate progressions in resistance and difficulty of his core stabilization exercises. He did require occasional cueing for abdominal engagement for lumbar control. Updated his HEP to progress his core stabilization at the gym. Patient would benefit from continued skilled PT to progress his mobility and strength in order to reduce pain and maximize functional ability.    OBJECTIVE IMPAIRMENTS: decreased activity tolerance, decreased strength, impaired flexibility, postural dysfunction, and pain.   ACTIVITY LIMITATIONS: carrying, lifting, bending, sitting, standing, squatting, and locomotion level  PARTICIPATION LIMITATIONS: meal prep, cleaning, shopping, community activity, occupation, and yard work  PERSONAL FACTORS: Fitness, Past/current experiences, and Time since onset of injury/illness/exacerbation are also affecting patient's functional outcome.    GOALS: Goals reviewed with  patient? Yes  SHORT TERM GOALS: Target date: 07/30/2023  Patient will be I with initial HEP in order to progress with therapy. Baseline: HEP provided at eval Goal status: INITIAL  2.  Patient will report low back pain </= 4/10 with activity in order to reduce functional limitations Baseline: 8/10 pain with  activity Goal status: INITIAL  LONG TERM GOALS: Target date: 08/27/2023  Patient will be I with final HEP to maintain progress from PT. Baseline: HEP provided at eval Goal status: INITIAL  2.  Patient will report >/= 60% status on FOTO to indicate improved functional ability. Baseline: 42% functional status Goal status: INITIAL  3.  Patient will exhibit DLLT </= 15 deg with good control and without onset of low back pain to indicate improved core control and activity tolerance Baseline: 45 deg Goal status: INITIAL  4.  Patient will exhibit hip strength >/= 4/5 MMT to allow for reduced pain with lifting and work related tasks Baseline: 4-/5 MMT Goal status: INITIAL  5. Patient will report no limitations with gym exercise due to low back pain so he can improve his general health and return to prior level of function  Baseline: patient limited with gym exercises due to low back pain  Goal status: INITIAL   PLAN: PT FREQUENCY: 1x/week  PT DURATION: 8 weeks  PLANNED INTERVENTIONS: Therapeutic exercises, Therapeutic activity, Neuromuscular re-education, Balance training, Gait training, Patient/Family education, Self Care, Joint mobilization, Joint manipulation, Aquatic Therapy, Dry Needling, Electrical stimulation, Spinal manipulation, Spinal mobilization, Cryotherapy, Moist heat, Traction, Manual therapy, and Re-evaluation.  PLAN FOR NEXT SESSION: Review HEP and progress PRN, progress core stabilization and hip strengthening, lifting mechanics and progress lifting, extension based movements as needed   Rosana Hoes, PT, DPT, LAT, ATC 07/10/23  9:27 AM Phone: [phone] Fax: [phone]

## 2023-07-16 NOTE — Therapy (Signed)
OUTPATIENT PHYSICAL THERAPY TREATMENT   Patient Name: [name] MRN: [id number] DOB:[birth year], 57 y.o., male Today's Date: 07/17/2023   END OF SESSION:  PT End of Session - 07/17/23 0932     Visit Number 3    Number of Visits 9    Date for PT Re-Evaluation 08/27/23    Authorization Type Rosann Auerbach / Rolene Arbour MCD    Authorization Time Period 07/08/2023 - 09/06/2023    Authorization - Visit Number 2    Authorization - Number of Visits 10    PT Start Time 0930    PT Stop Time 1010    PT Time Calculation (min) 40 min    Activity Tolerance Patient tolerated treatment well    Behavior During Therapy WFL for tasks assessed/performed               Past Medical History:  Diagnosis Date   Distal radius fracture, right    ETOH abuse    Past Surgical History:  Procedure Laterality Date   ANKLE SURGERY Right 2007   CARPAL TUNNEL RELEASE Right 05/07/2016   Procedure: RIGHT CARPAL TUNNEL RELEASE;  Surgeon: Dairl Ponder, MD;  Location: [facility];  Service: Orthopedics;  Laterality: Right;   KNEE ARTHROSCOPY Right    OPEN REDUCTION INTERNAL FIXATION (ORIF) DISTAL RADIAL FRACTURE Right 05/07/2016   Procedure: OPEN REDUCTION INTERNAL FIXATION (ORIF) RIGHT DISTAL RADIAL FRACTURE;  Surgeon: Dairl Ponder, MD;  Location: [facility];  Service: Orthopedics;  Laterality: Right;   There are no problems to display for this patient.   PCP: None  REFERRING PROVIDER: Unknown Jim, MD  REFERRING DIAG: lumbago with B/L radiculopathy  Rationale for Evaluation and Treatment: Rehabilitation  THERAPY DIAG:  Other low back pain  Muscle weakness (generalized)  ONSET DATE: worsened 5-6 months ago   SUBJECTIVE:                                                                                                                                                                                          SUBJECTIVE STATEMENT: Patient reports he is doing  well, he hasn't been able to make it to the gym because he has been working. He has been cutting back on the pills he was taking.   PAIN:  Are you having pain? Yes:  NPRS scale: 3/10 (8/10 worse) Pain location: Lower back Pain description: Sharp Aggravating factors: Standing, walking, lifting, increase in activity Relieving factors: Arching backward, medication  PERTINENT HISTORY: See PMH above  PRECAUTIONS: None  WEIGHT BEARING RESTRICTIONS: No  PATIENT GOALS: Pain relief and get back to work and exercise without limitation  OBJECTIVE:  PATIENT SURVEYS:  FOTO 42% functional status  MUSCLE LENGTH: Limitations noted   POSTURE:   Rounded shoulder posture  LUMBAR ROM:   AROM eval  Flexion WFL  Extension WFL  Right lateral flexion WFL  Left lateral flexion WFL  Right rotation WFL  Left rotation WFL   (Blank rows = not tested)  LOWER EXTREMITY MMT:    MMT Right eval Left eval Rt / Lt 07/10/2023  Hip flexion 4 4   Hip extension 4- 4- 4- / 4-  Hip abduction 4- 4- 4- / 4-  Hip adduction     Hip internal rotation     Hip external rotation     Knee flexion 5 5   Knee extension 5 5   Ankle dorsiflexion     Ankle plantarflexion     Ankle inversion     Ankle eversion      (Blank rows = not tested)  FUNCTIONAL TESTS:  DLLT: 45 deg with onset of low back pain  07/17/2023: 40 deg, no back pain   TODAY'S TREATMENT:      [facility] Adult PT Treatment:                                                DATE: 07/17/2023 Therapeutic Exercise: Recumbent bike L3 x 5 min while taking subjective Hip hinge sliding bar down/up thighs wit 25# x 10, 45# x 10 Hex bar dead lift 75# 3 x 10 Dumbbell 45# dead lift for gym demo x 10 LTR 5 x 5 sec each Bridge 2 x 10 with 5 sec hold Side clamshell with blue 2 x 20 each Prone press up 5 x 10 sec hold  Piriformis stretch 2 x 20 sec each   [facility] Adult PT Treatment:                                                DATE: 07/17/2023 Therapeutic  Exercise: Recumbent bike L3 x 5 min while taking subjective Prone press up 5 x 10 sec hold  Piriformis stretch 2 x 20 sec each LTR 5 x 5 sec each 90-90 alternating heel tap holding 10# KB to ceiling 2 x 20 Bridge 2 x 10 with 10 sec hold Modified side plank with clamshell with green 3 x 10 Modified front plank 3 x 30 sec Pallof press with FM 13# 2 x 10 each  [facility] Adult PT Treatment:                                                DATE: 07/10/2023 Therapeutic Exercise: Recumbent bike L3 x 5 min while taking subjective Prone press up 5 x 10 sec hold  Piriformis stretch 2 x 20 sec each LTR 5 x 5 sec each 90-90 alternating heel tap holding 10# KB to ceiling 2 x 20 Bridge 2 x 10 with 10 sec hold Modified side plank with clamshell with green 3 x 10 Modified front plank 3 x 30 sec Pallof press with FM 13# 2 x 10 each  PATIENT EDUCATION:  Education details: HEP update Person educated:  Patient Education method: Explanation, Demonstration, Tactile cues, Verbal cues, and Handouts Education comprehension: verbalized understanding, returned demonstration, verbal cues required, tactile cues required, and needs further education  HOME EXERCISE PROGRAM: Access Code: [email or web address]    ASSESSMENT: CLINICAL IMPRESSION: Patient tolerated therapy well with no adverse effects. Therapy focused on continued strengthening for the lumbar spine and hips with good tolerance. Initiated lifting mechanics and progression this visit. He did initially require cueing to maintain neutral lumbar spine but able to demonstrate proper technique following cues and was able to progress with weight with dead lift. He does exhibit improved lumbar control without any report of back pain. He does report his back gets tight with lifting but the stretches help to alleviate the tightness. Updated HEP to add dead lift for gym. Patient would benefit from continued skilled PT to progress his mobility and strength in order to reduce pain and  maximize functional ability.    OBJECTIVE IMPAIRMENTS: decreased activity tolerance, decreased strength, impaired flexibility, postural dysfunction, and pain.   ACTIVITY LIMITATIONS: carrying, lifting, bending, sitting, standing, squatting, and locomotion level  PARTICIPATION LIMITATIONS: meal prep, cleaning, shopping, community activity, occupation, and yard work  PERSONAL FACTORS: Fitness, Past/current experiences, and Time since onset of injury/illness/exacerbation are also affecting patient's functional outcome.    GOALS: Goals reviewed with patient? Yes  SHORT TERM GOALS: Target date: 07/30/2023  Patient will be I with initial HEP in order to progress with therapy. Baseline: HEP provided at eval Goal status: INITIAL  2.  Patient will report low back pain </= 4/10 with activity in order to reduce functional limitations Baseline: 8/10 pain with activity Goal status: INITIAL  LONG TERM GOALS: Target date: 08/27/2023  Patient will be I with final HEP to maintain progress from PT. Baseline: HEP provided at eval Goal status: INITIAL  2.  Patient will report >/= 60% status on FOTO to indicate improved functional ability. Baseline: 42% functional status Goal status: INITIAL  3.  Patient will exhibit DLLT </= 15 deg with good control and without onset of low back pain to indicate improved core control and activity tolerance Baseline: 45 deg Goal status: INITIAL  4.  Patient will exhibit hip strength >/= 4/5 MMT to allow for reduced pain with lifting and work related tasks Baseline: 4-/5 MMT Goal status: INITIAL  5. Patient will report no limitations with gym exercise due to low back pain so he can improve his general health and return to prior level of function  Baseline: patient limited with gym exercises due to low back pain  Goal status: INITIAL   PLAN: PT FREQUENCY: 1x/week  PT DURATION: 8 weeks  PLANNED INTERVENTIONS: Therapeutic exercises, Therapeutic activity,  Neuromuscular re-education, Balance training, Gait training, Patient/Family education, Self Care, Joint mobilization, Joint manipulation, Aquatic Therapy, Dry Needling, Electrical stimulation, Spinal manipulation, Spinal mobilization, Cryotherapy, Moist heat, Traction, Manual therapy, and Re-evaluation.  PLAN FOR NEXT SESSION: Review HEP and progress PRN, progress core stabilization and hip strengthening, lifting mechanics and progress lifting, extension based movements as needed   Rosana Hoes, PT, DPT, LAT, ATC 07/17/23  10:11 AM Phone: [phone] Fax: [phone]

## 2023-07-17 ENCOUNTER — Ambulatory Visit: Payer: Commercial Managed Care - HMO | Admitting: Physical Therapy

## 2023-07-17 ENCOUNTER — Encounter: Payer: Self-pay | Admitting: Physical Therapy

## 2023-07-17 ENCOUNTER — Other Ambulatory Visit: Payer: Self-pay

## 2023-07-17 DIAGNOSIS — M5459 Other low back pain: Secondary | ICD-10-CM | POA: Diagnosis not present

## 2023-07-17 DIAGNOSIS — M6281 Muscle weakness (generalized): Secondary | ICD-10-CM

## 2023-07-17 NOTE — Patient Instructions (Signed)
Access Code: [email or web address] URL: [email or web address] Date: 07/17/2023 Prepared by: Rosana Hoes  Exercises - Supine Piriformis Stretch with Foot on Ground  - 1 x daily - 3 reps - 30 seconds hold - Supine Bridge  - 1 x daily - 3 sets - 10 reps - 10 seconds hold - Side Plank on Knees  - 1 x daily - 3 reps - 20 seconds hold - Clamshell with Resistance  - 1 x daily - 3 sets - 10 reps - Prone Press Up  - 1 x daily - 3 sets - 10 reps - Plank on Knees  - 1 x daily - 3 reps - 20 seconds hold - Standing Anti-Rotation Press with Anchored Resistance  - 1 x daily - 3 sets - 10 reps - Kettlebell Deadlift  - 1 x daily - 3 sets - 10 reps

## 2023-07-18 DIAGNOSIS — M5459 Other low back pain: Secondary | ICD-10-CM | POA: Diagnosis present

## 2023-07-18 DIAGNOSIS — M6281 Muscle weakness (generalized): Secondary | ICD-10-CM | POA: Diagnosis present

## 2023-07-23 NOTE — Therapy (Signed)
OUTPATIENT PHYSICAL THERAPY TREATMENT   Patient Name: [name] MRN: [id number] DOB:13-Sep-1966, 57 y.o., male Today's Date: 07/24/2023   END OF SESSION:  PT End of Session - 07/24/23 0937     Visit Number 4    Number of Visits 9    Date for PT Re-Evaluation 08/27/23    Authorization Type Rosann Auerbach / Rolene Arbour MCD    Authorization Time Period 07/08/2023 - 09/06/2023    Authorization - Visit Number 3    Authorization - Number of Visits 10    PT Start Time 0933    PT Stop Time 1012    PT Time Calculation (min) 39 min    Activity Tolerance Patient tolerated treatment well    Behavior During Therapy WFL for tasks assessed/performed                Past Medical History:  Diagnosis Date   Distal radius fracture, right    ETOH abuse    Past Surgical History:  Procedure Laterality Date   ANKLE SURGERY Right 2007   CARPAL TUNNEL RELEASE Right 05/07/2016   Procedure: RIGHT CARPAL TUNNEL RELEASE;  Surgeon: Dairl Ponder, MD;  Location: [facility];  Service: Orthopedics;  Laterality: Right;   KNEE ARTHROSCOPY Right    OPEN REDUCTION INTERNAL FIXATION (ORIF) DISTAL RADIAL FRACTURE Right 05/07/2016   Procedure: OPEN REDUCTION INTERNAL FIXATION (ORIF) RIGHT DISTAL RADIAL FRACTURE;  Surgeon: Dairl Ponder, MD;  Location: [facility];  Service: Orthopedics;  Laterality: Right;   There are no problems to display for this patient.   PCP: None  REFERRING PROVIDER: Unknown Jim, MD  REFERRING DIAG: lumbago with B/L radiculopathy  Rationale for Evaluation and Treatment: Rehabilitation  THERAPY DIAG:  Other low back pain  Muscle weakness (generalized)  ONSET DATE: worsened 5-6 months ago   SUBJECTIVE:                                                                                                                                                                                          SUBJECTIVE STATEMENT: Patient reports his back  has been feeling pretty good since therapy. He will occasionally take some medication if he is having some aching.   PAIN:  Are you having pain? Yes:  NPRS scale: 1/10 (8/10 worse) Pain location: Lower back Pain description: Sharp Aggravating factors: Standing, walking, lifting, increase in activity Relieving factors: Arching backward, medication  PERTINENT HISTORY: See PMH above  PRECAUTIONS: None  WEIGHT BEARING RESTRICTIONS: No  PATIENT GOALS: Pain relief and get back to work and exercise without limitation   OBJECTIVE:  PATIENT SURVEYS:  FOTO 42% functional  status  07/24/2023: 56%  MUSCLE LENGTH: Limitations noted   POSTURE:   Rounded shoulder posture  LUMBAR ROM:   AROM eval  Flexion WFL  Extension WFL  Right lateral flexion WFL  Left lateral flexion WFL  Right rotation WFL  Left rotation WFL   (Blank rows = not tested)  LOWER EXTREMITY MMT:    MMT Right eval Left eval Rt / Lt 07/10/2023  Hip flexion 4 4   Hip extension 4- 4- 4- / 4-  Hip abduction 4- 4- 4- / 4-  Hip adduction     Hip internal rotation     Hip external rotation     Knee flexion 5 5   Knee extension 5 5   Ankle dorsiflexion     Ankle plantarflexion     Ankle inversion     Ankle eversion      (Blank rows = not tested)  FUNCTIONAL TESTS:  DLLT: 45 deg with onset of low back pain  07/17/2023: 40 deg, no back pain   TODAY'S TREATMENT:      [facility] Adult PT Treatment:                                                DATE: 07/24/2023 Therapeutic Exercise: Recumbent bike L3 x 5 min while taking subjective LTR 5 x 5 sec each Bridge x 10 with 5 sec hold Marching bridge x 10 90-90 alternating leg extension 2 x 20 Pallof press with blue 2 x 10 each Hex bar dead lift 95# 4 x 6 Prone press up 5 x 10 sec hold  Piriformis stretch 2 x 20 sec each Sidelying thoracolumbar rotation x 10 each   [facility] Adult PT Treatment:                                                DATE: 07/17/2023 Therapeutic  Exercise: Recumbent bike L3 x 5 min while taking subjective Hip hinge sliding bar down/up thighs wit 25# x 10, 45# x 10 Hex bar dead lift 75# 3 x 10 Dumbbell 45# dead lift for gym demo x 10 LTR 5 x 5 sec each Bridge 2 x 10 with 5 sec hold Side clamshell with blue 2 x 20 each Prone press up 5 x 10 sec hold  Piriformis stretch 2 x 20 sec each  [facility] Adult PT Treatment:                                                DATE: 07/10/2023 Therapeutic Exercise: Recumbent bike L3 x 5 min while taking subjective Prone press up 5 x 10 sec hold  Piriformis stretch 2 x 20 sec each LTR 5 x 5 sec each 90-90 alternating heel tap holding 10# KB to ceiling 2 x 20 Bridge 2 x 10 with 10 sec hold Modified side plank with clamshell with green 3 x 10 Modified front plank 3 x 30 sec Pallof press with FM 13# 2 x 10 each  PATIENT EDUCATION:  Education details: HEP Person educated: Patient Education method: Explanation, Demonstration, Actor cues, Verbal cues Education comprehension: verbalized  understanding, returned demonstration, verbal cues required, tactile cues required, and needs further education  HOME EXERCISE PROGRAM: Access Code: [email or web address]    ASSESSMENT: CLINICAL IMPRESSION: Patient tolerated therapy well with no adverse effects. Therapy focused primarily on progressing lumbar mobility and core stabilization, and lifting with good tolerance. He did tolerate progressing of weight and difficulty with his exercises without any increase in back pain. He does report improvement in his functional ability on FOTO this visit. No changes made to HEP. Patient would benefit from continued skilled PT to progress his mobility and strength in order to reduce pain and maximize functional ability.    OBJECTIVE IMPAIRMENTS: decreased activity tolerance, decreased strength, impaired flexibility, postural dysfunction, and pain.   ACTIVITY LIMITATIONS: carrying, lifting, bending, sitting, standing, squatting, and  locomotion level  PARTICIPATION LIMITATIONS: meal prep, cleaning, shopping, community activity, occupation, and yard work  PERSONAL FACTORS: Fitness, Past/current experiences, and Time since onset of injury/illness/exacerbation are also affecting patient's functional outcome.    GOALS: Goals reviewed with patient? Yes  SHORT TERM GOALS: Target date: 07/30/2023  Patient will be I with initial HEP in order to progress with therapy. Baseline: HEP provided at eval Goal status: INITIAL  2.  Patient will report low back pain </= 4/10 with activity in order to reduce functional limitations Baseline: 8/10 pain with activity Goal status: INITIAL  LONG TERM GOALS: Target date: 08/27/2023  Patient will be I with final HEP to maintain progress from PT. Baseline: HEP provided at eval Goal status: INITIAL  2.  Patient will report >/= 60% status on FOTO to indicate improved functional ability. Baseline: 42% functional status 07/24/2023: 56% Goal status: ONGOING  3.  Patient will exhibit DLLT </= 15 deg with good control and without onset of low back pain to indicate improved core control and activity tolerance Baseline: 45 deg Goal status: INITIAL  4.  Patient will exhibit hip strength >/= 4/5 MMT to allow for reduced pain with lifting and work related tasks Baseline: 4-/5 MMT Goal status: INITIAL  5. Patient will report no limitations with gym exercise due to low back pain so he can improve his general health and return to prior level of function  Baseline: patient limited with gym exercises due to low back pain  Goal status: INITIAL   PLAN: PT FREQUENCY: 1x/week  PT DURATION: 8 weeks  PLANNED INTERVENTIONS: Therapeutic exercises, Therapeutic activity, Neuromuscular re-education, Balance training, Gait training, Patient/Family education, Self Care, Joint mobilization, Joint manipulation, Aquatic Therapy, Dry Needling, Electrical stimulation, Spinal manipulation, Spinal mobilization,  Cryotherapy, Moist heat, Traction, Manual therapy, and Re-evaluation.  PLAN FOR NEXT SESSION: Review HEP and progress PRN, progress core stabilization and hip strengthening, lifting mechanics and progress lifting, extension based movements as needed   Rosana Hoes, PT, DPT, LAT, ATC 07/24/23  10:16 AM Phone: [phone] Fax: [phone]

## 2023-07-24 ENCOUNTER — Other Ambulatory Visit: Payer: Self-pay

## 2023-07-24 ENCOUNTER — Ambulatory Visit: Payer: Commercial Managed Care - HMO | Admitting: Physical Therapy

## 2023-07-24 ENCOUNTER — Encounter: Payer: Self-pay | Admitting: Physical Therapy

## 2023-07-24 DIAGNOSIS — M5459 Other low back pain: Secondary | ICD-10-CM | POA: Diagnosis not present

## 2023-07-24 DIAGNOSIS — M6281 Muscle weakness (generalized): Secondary | ICD-10-CM

## 2023-07-31 ENCOUNTER — Ambulatory Visit: Payer: Medicaid Other | Attending: Family Medicine | Admitting: Physical Therapy

## 2023-07-31 ENCOUNTER — Other Ambulatory Visit: Payer: Self-pay

## 2023-07-31 ENCOUNTER — Encounter: Payer: Self-pay | Admitting: Physical Therapy

## 2023-07-31 DIAGNOSIS — M6281 Muscle weakness (generalized): Secondary | ICD-10-CM

## 2023-07-31 DIAGNOSIS — M5459 Other low back pain: Secondary | ICD-10-CM

## 2023-07-31 NOTE — Therapy (Signed)
OUTPATIENT PHYSICAL THERAPY TREATMENT   Patient Name: [name] MRN: [id number] DOB:02-21-1966, 57 y.o., male Today's Date: 07/31/2023   END OF SESSION:  PT End of Session - 07/31/23 0912     Visit Number 5    Number of Visits 9    Date for PT Re-Evaluation 08/27/23    Authorization Type Rosann Auerbach / Rolene Arbour MCD    Authorization Time Period 07/08/2023 - 09/06/2023    Authorization - Visit Number 4    Authorization - Number of Visits 10    PT Start Time 0845    PT Stop Time 0925    PT Time Calculation (min) 40 min    Activity Tolerance Patient tolerated treatment well    Behavior During Therapy [facility] for tasks assessed/performed                 Past Medical History:  Diagnosis Date   Distal radius fracture, right    ETOH abuse    Past Surgical History:  Procedure Laterality Date   ANKLE SURGERY Right 2007   CARPAL TUNNEL RELEASE Right 05/07/2016   Procedure: RIGHT CARPAL TUNNEL RELEASE;  Surgeon: Dairl Ponder, MD;  Location: [facility];  Service: Orthopedics;  Laterality: Right;   KNEE ARTHROSCOPY Right    OPEN REDUCTION INTERNAL FIXATION (ORIF) DISTAL RADIAL FRACTURE Right 05/07/2016   Procedure: OPEN REDUCTION INTERNAL FIXATION (ORIF) RIGHT DISTAL RADIAL FRACTURE;  Surgeon: Dairl Ponder, MD;  Location: [facility];  Service: Orthopedics;  Laterality: Right;   There are no problems to display for this patient.   PCP: None  REFERRING PROVIDER: Unknown Jim, MD  REFERRING DIAG: lumbago with B/L radiculopathy  Rationale for Evaluation and Treatment: Rehabilitation  THERAPY DIAG:  Other low back pain  Muscle weakness (generalized)  ONSET DATE: worsened 5-6 months ago   SUBJECTIVE:                                                                                                                                                                                          SUBJECTIVE STATEMENT: Patient reports he rode  the bike at the gym for about 30 minutes the other day and he did have some pain after that. Otherwise he is doing well.  PAIN:  Are you having pain? Yes:  NPRS scale: 1/10 (6/10 worse) Pain location: Lower back Pain description: Sharp Aggravating factors: Standing, walking, lifting, increase in activity Relieving factors: Arching backward, medication  PERTINENT HISTORY: See PMH above  PRECAUTIONS: None  WEIGHT BEARING RESTRICTIONS: No  PATIENT GOALS: Pain relief and get back to work and exercise without limitation   OBJECTIVE:  PATIENT SURVEYS:  FOTO 42% functional status  07/24/2023: 56%  MUSCLE LENGTH: Limitations noted   POSTURE:   Rounded shoulder posture  LUMBAR ROM:   AROM eval  Flexion WFL  Extension WFL  Right lateral flexion WFL  Left lateral flexion WFL  Right rotation WFL  Left rotation WFL   (Blank rows = not tested)  LOWER EXTREMITY MMT:    MMT Right eval Left eval Rt / Lt 07/10/2023 Rt / Lt 07/31/2023  Hip flexion 4 4    Hip extension 4- 4- 4- / 4- 4 / 4  Hip abduction 4- 4- 4- / 4- 4 / 4  Hip adduction      Hip internal rotation      Hip external rotation      Knee flexion 5 5    Knee extension 5 5    Ankle dorsiflexion      Ankle plantarflexion      Ankle inversion      Ankle eversion       (Blank rows = not tested)  FUNCTIONAL TESTS:  DLLT: 45 deg with onset of low back pain  07/17/2023: 40 deg, no back pain   TODAY'S TREATMENT:      [facility] Adult PT Treatment:                                                DATE: 07/31/2023 Therapeutic Exercise: Recumbent bike L3 x 5 min while taking subjective LTR x 10 Cat cow x 10 Bird dog x 10 Hex bar dead lift 95# x 6 Farmer's carry 25# x 3 lengths down/back each Side clamshell with blue 2 x 15 each Marching bridge 3 x 10 Piriformis stretch 3 x 20 sec each Sidelying thoracolumbar rotation x 10 each Prone press up x 10   [facility] Adult PT Treatment:                                                 DATE: 07/24/2023 Therapeutic Exercise: Recumbent bike L3 x 5 min while taking subjective LTR 5 x 5 sec each Bridge x 10 with 5 sec hold Marching bridge x 10 90-90 alternating leg extension 2 x 20 Pallof press with blue 2 x 10 each Hex bar dead lift 95# 4 x 6 Prone press up 5 x 10 sec hold  Piriformis stretch 2 x 20 sec each Sidelying thoracolumbar rotation x 10 each  [facility] Adult PT Treatment:                                                DATE: 07/17/2023 Therapeutic Exercise: Recumbent bike L3 x 5 min while taking subjective Hip hinge sliding bar down/up thighs wit 25# x 10, 45# x 10 Hex bar dead lift 75# 3 x 10 Dumbbell 45# dead lift for gym demo x 10 LTR 5 x 5 sec each Bridge 2 x 10 with 5 sec hold Side clamshell with blue 2 x 20 each Prone press up 5 x 10 sec hold  Piriformis stretch 2 x 20 sec each  [facility] Adult PT Treatment:  DATE: 07/10/2023 Therapeutic Exercise: Recumbent bike L3 x 5 min while taking subjective Prone press up 5 x 10 sec hold  Piriformis stretch 2 x 20 sec each LTR 5 x 5 sec each 90-90 alternating heel tap holding 10# KB to ceiling 2 x 20 Bridge 2 x 10 with 10 sec hold Modified side plank with clamshell with green 3 x 10 Modified front plank 3 x 30 sec Pallof press with FM 13# 2 x 10 each  PATIENT EDUCATION:  Education details: HEP Person educated: Patient Education method: Programmer, multimedia, Demonstration, Actor cues, Verbal cues Education comprehension: verbalized understanding, returned demonstration, verbal cues required, tactile cues required, and needs further education  HOME EXERCISE PROGRAM: Access Code: [email or web address]    ASSESSMENT: CLINICAL IMPRESSION: Patient tolerated therapy well with no adverse effects. Continued therapy focus on progressing his strength and control with good tolerance. He was able to increase weight with lifting and demonstrates improved lumbar control with stabilization exercises.  He does exhibit improved hip strength this visit and seems to be progressing well with his activity level. No changes to his HEP this visit. Patient would benefit from continued skilled PT to progress his mobility and strength in order to reduce pain and maximize functional ability.    OBJECTIVE IMPAIRMENTS: decreased activity tolerance, decreased strength, impaired flexibility, postural dysfunction, and pain.   ACTIVITY LIMITATIONS: carrying, lifting, bending, sitting, standing, squatting, and locomotion level  PARTICIPATION LIMITATIONS: meal prep, cleaning, shopping, community activity, occupation, and yard work  PERSONAL FACTORS: Fitness, Past/current experiences, and Time since onset of injury/illness/exacerbation are also affecting patient's functional outcome.    GOALS: Goals reviewed with patient? Yes  SHORT TERM GOALS: Target date: 07/30/2023  Patient will be I with initial HEP in order to progress with therapy. Baseline: HEP provided at eval 07/31/2023: independent Goal status: MET  2.  Patient will report low back pain </= 4/10 with activity in order to reduce functional limitations Baseline: 8/10 pain with activity 07/31/2023: 6/10 Goal status: ONGOING  LONG TERM GOALS: Target date: 08/27/2023  Patient will be I with final HEP to maintain progress from PT. Baseline: HEP provided at eval Goal status: INITIAL  2.  Patient will report >/= 60% status on FOTO to indicate improved functional ability. Baseline: 42% functional status 07/24/2023: 56% Goal status: ONGOING  3.  Patient will exhibit DLLT </= 15 deg with good control and without onset of low back pain to indicate improved core control and activity tolerance Baseline: 45 deg Goal status: INITIAL  4.  Patient will exhibit hip strength >/= 4/5 MMT to allow for reduced pain with lifting and work related tasks Baseline: 4-/5 MMT Goal status: INITIAL  5. Patient will report no limitations with gym exercise due to low  back pain so he can improve his general health and return to prior level of function  Baseline: patient limited with gym exercises due to low back pain  Goal status: INITIAL   PLAN: PT FREQUENCY: 1x/week  PT DURATION: 8 weeks  PLANNED INTERVENTIONS: Therapeutic exercises, Therapeutic activity, Neuromuscular re-education, Balance training, Gait training, Patient/Family education, Self Care, Joint mobilization, Joint manipulation, Aquatic Therapy, Dry Needling, Electrical stimulation, Spinal manipulation, Spinal mobilization, Cryotherapy, Moist heat, Traction, Manual therapy, and Re-evaluation.  PLAN FOR NEXT SESSION: Review HEP and progress PRN, progress core stabilization and hip strengthening, lifting mechanics and progress lifting, extension based movements as needed   Rosana Hoes, PT, DPT, LAT, ATC 07/31/23  9:29 AM Phone: [phone] Fax: [phone]

## 2023-08-15 ENCOUNTER — Encounter: Payer: Self-pay | Admitting: Physical Therapy

## 2023-08-15 ENCOUNTER — Ambulatory Visit: Payer: Medicaid Other | Admitting: Physical Therapy

## 2023-08-15 DIAGNOSIS — M5459 Other low back pain: Secondary | ICD-10-CM

## 2023-08-15 DIAGNOSIS — M6281 Muscle weakness (generalized): Secondary | ICD-10-CM

## 2023-08-15 NOTE — Therapy (Signed)
OUTPATIENT PHYSICAL THERAPY TREATMENT   Patient Name: [name] MRN: [id number] DOB:[birth year], 57 y.o., male Today's Date: 08/15/2023   END OF SESSION:  PT End of Session - 08/15/23 0831     Visit Number 6    Number of Visits 9    Date for PT Re-Evaluation 08/27/23    Authorization Type Rosann Auerbach / Rolene Arbour MCD    Authorization Time Period 07/08/2023 - 09/06/2023    Authorization - Number of Visits 10    PT Start Time 0830    PT Stop Time 0912    PT Time Calculation (min) 42 min    Activity Tolerance Patient tolerated treatment well    Behavior During Therapy [facility] for tasks assessed/performed                 Past Medical History:  Diagnosis Date   Distal radius fracture, right    ETOH abuse    Past Surgical History:  Procedure Laterality Date   ANKLE SURGERY Right 2007   CARPAL TUNNEL RELEASE Right 05/07/2016   Procedure: RIGHT CARPAL TUNNEL RELEASE;  Surgeon: Dairl Ponder, MD;  Location: [facility];  Service: Orthopedics;  Laterality: Right;   KNEE ARTHROSCOPY Right    OPEN REDUCTION INTERNAL FIXATION (ORIF) DISTAL RADIAL FRACTURE Right 05/07/2016   Procedure: OPEN REDUCTION INTERNAL FIXATION (ORIF) RIGHT DISTAL RADIAL FRACTURE;  Surgeon: Dairl Ponder, MD;  Location: [facility];  Service: Orthopedics;  Laterality: Right;   There are no problems to display for this patient.   PCP: None  REFERRING PROVIDER: Unknown Jim, MD  REFERRING DIAG: lumbago with B/L radiculopathy  Rationale for Evaluation and Treatment: Rehabilitation  THERAPY DIAG:  Other low back pain  Muscle weakness (generalized)  ONSET DATE: worsened 5-6 months ago   SUBJECTIVE:                                                                                                                                                                                          SUBJECTIVE STATEMENT: Pt reports that he has been doing his stretches so he is a  little sore.  He rates his pain @ 4/10.  He did not take any pain medication this morning.  PAIN:  Are you having pain? Yes:  NPRS scale: 1/10 (6/10 worse) Pain location: Lower back Pain description: Sharp Aggravating factors: Standing, walking, lifting, increase in activity Relieving factors: Arching backward, medication  PERTINENT HISTORY: See PMH above  PRECAUTIONS: None  WEIGHT BEARING RESTRICTIONS: No  PATIENT GOALS: Pain relief and get back to work and exercise without limitation   OBJECTIVE:  PATIENT SURVEYS:  FOTO 42%  functional status  07/24/2023: 56%  MUSCLE LENGTH: Limitations noted   POSTURE:   Rounded shoulder posture  LUMBAR ROM:   AROM eval  Flexion WFL  Extension WFL  Right lateral flexion WFL  Left lateral flexion WFL  Right rotation WFL  Left rotation WFL   (Blank rows = not tested)  LOWER EXTREMITY MMT:    MMT Right eval Left eval Rt / Lt 07/10/2023 Rt / Lt 07/31/2023  Hip flexion 4 4    Hip extension 4- 4- 4- / 4- 4 / 4  Hip abduction 4- 4- 4- / 4- 4 / 4  Hip adduction      Hip internal rotation      Hip external rotation      Knee flexion 5 5    Knee extension 5 5    Ankle dorsiflexion      Ankle plantarflexion      Ankle inversion      Ankle eversion       (Blank rows = not tested)  FUNCTIONAL TESTS:  DLLT: 45 deg with onset of low back pain  07/17/2023: 40 deg, no back pain   TODAY'S TREATMENT:      [facility] Adult PT Treatment:                                                DATE: 08/15/2023 Therapeutic Exercise: Recumbent bike L3 x 5 min while taking subjective LTR x 10 Cat cow 2 x 10 Bird dog x 12 - 3'' holds Hex bar dead lift 95# 3 x 6 Farmer's carry 25# x 3 lengths down/back each Side clamshell with blue 2 x 15 each Marching bridge 3 x 10  Piriformis stretch 3 x 20 sec each Sidelying thoracolumbar rotation x 10 each SLR + trunk rotation - 10x ea Prone press up x 10   [facility] Adult PT Treatment:                                                 DATE: 07/24/2023 Therapeutic Exercise: Recumbent bike L3 x 5 min while taking subjective LTR 5 x 5 sec each Bridge x 10 with 5 sec hold Marching bridge x 10 90-90 alternating leg extension 2 x 20 Pallof press with blue 2 x 10 each Hex bar dead lift 95# 4 x 6 Prone press up 5 x 10 sec hold  Piriformis stretch 2 x 20 sec each Sidelying thoracolumbar rotation x 10 each  [facility] Adult PT Treatment:                                                DATE: 07/17/2023 Therapeutic Exercise: Recumbent bike L3 x 5 min while taking subjective Hip hinge sliding bar down/up thighs wit 25# x 10, 45# x 10 Hex bar dead lift 75# 3 x 10 Dumbbell 45# dead lift for gym demo x 10 LTR 5 x 5 sec each Bridge 2 x 10 with 5 sec hold Side clamshell with blue 2 x 20 each Prone press up 5 x 10 sec hold  Piriformis stretch 2 x 20  sec each  [facility] Adult PT Treatment:                                                DATE: 07/10/2023 Therapeutic Exercise: Recumbent bike L3 x 5 min while taking subjective Prone press up 5 x 10 sec hold  Piriformis stretch 2 x 20 sec each LTR 5 x 5 sec each 90-90 alternating heel tap holding 10# KB to ceiling 2 x 20 Bridge 2 x 10 with 10 sec hold Modified side plank with clamshell with green 3 x 10 Modified front plank 3 x 30 sec Pallof press with FM 13# 2 x 10 each  PATIENT EDUCATION:  Education details: HEP Person educated: Patient Education method: Programmer, multimedia, Demonstration, Actor cues, Verbal cues Education comprehension: verbalized understanding, returned demonstration, verbal cues required, tactile cues required, and needs further education  HOME EXERCISE PROGRAM: Access Code: [email or web address]    ASSESSMENT: CLINICAL IMPRESSION: Patient tolerated therapy well with no adverse effects. Continued therapy focus on progressing his strength and control with good tolerance. Pt cued for pacing and form throughout. Pt with higher baseline pain today so intensity kept  roughly the same.  After exercise pt reports pain reduction to 1/10. No changes to his HEP this visit. Patient would benefit from continued skilled PT to progress his mobility and strength in order to reduce pain and maximize functional ability.    OBJECTIVE IMPAIRMENTS: decreased activity tolerance, decreased strength, impaired flexibility, postural dysfunction, and pain.   ACTIVITY LIMITATIONS: carrying, lifting, bending, sitting, standing, squatting, and locomotion level  PARTICIPATION LIMITATIONS: meal prep, cleaning, shopping, community activity, occupation, and yard work  PERSONAL FACTORS: Fitness, Past/current experiences, and Time since onset of injury/illness/exacerbation are also affecting patient's functional outcome.    GOALS: Goals reviewed with patient? Yes  SHORT TERM GOALS: Target date: 07/30/2023  Patient will be I with initial HEP in order to progress with therapy. Baseline: HEP provided at eval 07/31/2023: independent Goal status: MET  2.  Patient will report low back pain </= 4/10 with activity in order to reduce functional limitations Baseline: 8/10 pain with activity 07/31/2023: 6/10 Goal status: ONGOING  LONG TERM GOALS: Target date: 08/27/2023  Patient will be I with final HEP to maintain progress from PT. Baseline: HEP provided at eval Goal status: INITIAL  2.  Patient will report >/= 60% status on FOTO to indicate improved functional ability. Baseline: 42% functional status 07/24/2023: 56% Goal status: ONGOING  3.  Patient will exhibit DLLT </= 15 deg with good control and without onset of low back pain to indicate improved core control and activity tolerance Baseline: 45 deg Goal status: INITIAL  4.  Patient will exhibit hip strength >/= 4/5 MMT to allow for reduced pain with lifting and work related tasks Baseline: 4-/5 MMT Goal status: INITIAL  5. Patient will report no limitations with gym exercise due to low back pain so he can improve his general  health and return to prior level of function  Baseline: patient limited with gym exercises due to low back pain  Goal status: INITIAL   PLAN: PT FREQUENCY: 1x/week  PT DURATION: 8 weeks  PLANNED INTERVENTIONS: Therapeutic exercises, Therapeutic activity, Neuromuscular re-education, Balance training, Gait training, Patient/Family education, Self Care, Joint mobilization, Joint manipulation, Aquatic Therapy, Dry Needling, Electrical stimulation, Spinal manipulation, Spinal mobilization, Cryotherapy, Moist heat, Traction, Manual therapy, and Re-evaluation.  PLAN FOR NEXT SESSION: Review HEP and progress PRN, progress core stabilization and hip strengthening, lifting mechanics and progress lifting, extension based movements as needed   Fredderick Phenix PT 08/15/23  9:12 AM Phone: [phone] Fax: [phone]

## 2023-08-19 ENCOUNTER — Ambulatory Visit: Payer: Medicaid Other

## 2023-08-19 DIAGNOSIS — M6281 Muscle weakness (generalized): Secondary | ICD-10-CM

## 2023-08-19 DIAGNOSIS — M5459 Other low back pain: Secondary | ICD-10-CM | POA: Diagnosis not present

## 2023-08-19 NOTE — Therapy (Signed)
OUTPATIENT PHYSICAL THERAPY TREATMENT   Patient Name: [name] MRN: [id number] DOB:August 18, 1966, 57 y.o., male Today's Date: 08/19/2023   END OF SESSION:  PT End of Session - 08/19/23 0832     Visit Number 7    Number of Visits 9    Date for PT Re-Evaluation 08/27/23    Authorization Type Rosann Auerbach / Rolene Arbour MCD    Authorization Time Period 07/08/2023 - 09/06/2023    Authorization - Number of Visits 10    PT Start Time 0831    PT Stop Time 0910    PT Time Calculation (min) 39 min    Activity Tolerance Patient tolerated treatment well    Behavior During Therapy [facility] for tasks assessed/performed                  Past Medical History:  Diagnosis Date   Distal radius fracture, right    ETOH abuse    Past Surgical History:  Procedure Laterality Date   ANKLE SURGERY Right 2007   CARPAL TUNNEL RELEASE Right 05/07/2016   Procedure: RIGHT CARPAL TUNNEL RELEASE;  Surgeon: Dairl Ponder, MD;  Location: [facility];  Service: Orthopedics;  Laterality: Right;   KNEE ARTHROSCOPY Right    OPEN REDUCTION INTERNAL FIXATION (ORIF) DISTAL RADIAL FRACTURE Right 05/07/2016   Procedure: OPEN REDUCTION INTERNAL FIXATION (ORIF) RIGHT DISTAL RADIAL FRACTURE;  Surgeon: Dairl Ponder, MD;  Location: [facility];  Service: Orthopedics;  Laterality: Right;   There are no problems to display for this patient.   PCP: None  REFERRING PROVIDER: Unknown Jim, MD  REFERRING DIAG: lumbago with B/L radiculopathy  Rationale for Evaluation and Treatment: Rehabilitation  THERAPY DIAG:  Other low back pain  Muscle weakness (generalized)  ONSET DATE: worsened 5-6 months ago   SUBJECTIVE:                                                                                                                                                                                          SUBJECTIVE STATEMENT: Exercised this AM and feels back is loosening up.  Elicits  a "pop" at times in low back(hip?).   PAIN:  Are you having pain? Yes:  NPRS scale: 1/10 (6/10 worse) Pain location: Lower back Pain description: Sharp Aggravating factors: Standing, walking, lifting, increase in activity Relieving factors: Arching backward, medication  PERTINENT HISTORY: See PMH above  PRECAUTIONS: None  WEIGHT BEARING RESTRICTIONS: No  PATIENT GOALS: Pain relief and get back to work and exercise without limitation   OBJECTIVE:  PATIENT SURVEYS:  FOTO 42% functional status  07/24/2023: 55%  MUSCLE LENGTH: Limitations noted  POSTURE:   Rounded shoulder posture  LUMBAR ROM:   AROM eval  Flexion WFL  Extension WFL  Right lateral flexion WFL  Left lateral flexion WFL  Right rotation WFL  Left rotation WFL   (Blank rows = not tested)  LOWER EXTREMITY MMT:    MMT Right eval Left eval Rt / Lt 07/10/2023 Rt / Lt 07/31/2023  Hip flexion 4 4    Hip extension 4- 4- 4- / 4- 4 / 4  Hip abduction 4- 4- 4- / 4- 4 / 4  Hip adduction      Hip internal rotation      Hip external rotation      Knee flexion 5 5    Knee extension 5 5    Ankle dorsiflexion      Ankle plantarflexion      Ankle inversion      Ankle eversion       (Blank rows = not tested)  FUNCTIONAL TESTS:  DLLT: 45 deg with onset of low back pain  07/17/2023: 40 deg, no back pain   TODAY'S TREATMENT:      [facility] Adult PT Treatment:                                                DATE: 08/19/23 Therapeutic Exercise: Recumbent bike L3 x 6 min LTR x 10 Cat cow 2 x 10 Bird dog x 10 - 3'' holds Side clamshell with blue 2 x 15 B Marching bridge 2 x 10  Piriformis stretch 2 x 30 sec each Open book with breathing patterns 10x B Prone press up x 10 focus on exhale   [facility] Adult PT Treatment:                                                DATE: 08/15/2023 Therapeutic Exercise: Recumbent bike L3 x 5 min while taking subjective LTR x 10 Cat cow 2 x 10 Bird dog x 12 - 3'' holds Hex bar  dead lift 95# 3 x 6 Farmer's carry 25# x 3 lengths down/back each Side clamshell with blue 2 x 15 each Marching bridge 3 x 10  Piriformis stretch 3 x 20 sec each Sidelying thoracolumbar rotation x 10 each SLR + trunk rotation - 10x ea Prone press up x 10   [facility] Adult PT Treatment:                                                DATE: 07/24/2023 Therapeutic Exercise: Recumbent bike L3 x 5 min while taking subjective LTR 5 x 5 sec each Bridge x 10 with 5 sec hold Marching bridge x 10 90-90 alternating leg extension 2 x 20 Pallof press with blue 2 x 10 each Hex bar dead lift 95# 4 x 6 Prone press up 5 x 10 sec hold  Piriformis stretch 2 x 20 sec each Sidelying thoracolumbar rotation x 10 each  [facility] Adult PT Treatment:  DATE: 07/17/2023 Therapeutic Exercise: Recumbent bike L3 x 5 min while taking subjective Hip hinge sliding bar down/up thighs wit 25# x 10, 45# x 10 Hex bar dead lift 75# 3 x 10 Dumbbell 45# dead lift for gym demo x 10 LTR 5 x 5 sec each Bridge 2 x 10 with 5 sec hold Side clamshell with blue 2 x 20 each Prone press up 5 x 10 sec hold  Piriformis stretch 2 x 20 sec each  [facility] Adult PT Treatment:                                                DATE: 07/10/2023 Therapeutic Exercise: Recumbent bike L3 x 5 min while taking subjective Prone press up 5 x 10 sec hold  Piriformis stretch 2 x 20 sec each LTR 5 x 5 sec each 90-90 alternating heel tap holding 10# KB to ceiling 2 x 20 Bridge 2 x 10 with 10 sec hold Modified side plank with clamshell with green 3 x 10 Modified front plank 3 x 30 sec Pallof press with FM 13# 2 x 10 each  PATIENT EDUCATION:  Education details: HEP Person educated: Patient Education method: Programmer, multimedia, Demonstration, Actor cues, Verbal cues Education comprehension: verbalized understanding, returned demonstration, verbal cues required, tactile cues required, and needs further education  HOME  EXERCISE PROGRAM: Access Code: [email or web address]    ASSESSMENT: CLINICAL IMPRESSION: Focus of today was lumbar stretch, strength, flexibility, aerobic work and core training.  Increased resistance and challenge as noted.  Incorporated breathing patterns into exercises to promote relaxation and additional stretch.  Some difficulty with coordinating more complex tasks such as cat/cow and bird dog  Patient tolerated therapy well with no adverse effects. Continued therapy focus on progressing his strength and control with good tolerance. Pt cued for pacing and form throughout. Pt with higher baseline pain today so intensity kept roughly the same.  After exercise pt reports pain reduction to 1/10. No changes to his HEP this visit. Patient would benefit from continued skilled PT to progress his mobility and strength in order to reduce pain and maximize functional ability.    OBJECTIVE IMPAIRMENTS: decreased activity tolerance, decreased strength, impaired flexibility, postural dysfunction, and pain.   ACTIVITY LIMITATIONS: carrying, lifting, bending, sitting, standing, squatting, and locomotion level  PARTICIPATION LIMITATIONS: meal prep, cleaning, shopping, community activity, occupation, and yard work  PERSONAL FACTORS: Fitness, Past/current experiences, and Time since onset of injury/illness/exacerbation are also affecting patient's functional outcome.    GOALS: Goals reviewed with patient? Yes  SHORT TERM GOALS: Target date: 07/30/2023  Patient will be I with initial HEP in order to progress with therapy. Baseline: HEP provided at eval 07/31/2023: independent Goal status: MET  2.  Patient will report low back pain </= 4/10 with activity in order to reduce functional limitations Baseline: 8/10 pain with activity 07/31/2023: 6/10 Goal status: ONGOING  LONG TERM GOALS: Target date: 08/27/2023  Patient will be I with final HEP to maintain progress from PT. Baseline: HEP provided at eval Goal status:  INITIAL  2.  Patient will report >/= 60% status on FOTO to indicate improved functional ability. Baseline: 42% functional status 07/24/2023: 55% Goal status: ONGOING  3.  Patient will exhibit DLLT </= 15 deg with good control and without onset of low back pain to indicate improved core control and activity tolerance Baseline: 45 deg  Goal status: INITIAL  4.  Patient will exhibit hip strength >/= 4/5 MMT to allow for reduced pain with lifting and work related tasks Baseline: 4-/5 MMT Goal status: INITIAL  5. Patient will report no limitations with gym exercise due to low back pain so he can improve his general health and return to prior level of function  Baseline: patient limited with gym exercises due to low back pain  Goal status: INITIAL   PLAN: PT FREQUENCY: 1x/week  PT DURATION: 8 weeks  PLANNED INTERVENTIONS: Therapeutic exercises, Therapeutic activity, Neuromuscular re-education, Balance training, Gait training, Patient/Family education, Self Care, Joint mobilization, Joint manipulation, Aquatic Therapy, Dry Needling, Electrical stimulation, Spinal manipulation, Spinal mobilization, Cryotherapy, Moist heat, Traction, Manual therapy, and Re-evaluation.  PLAN FOR NEXT SESSION: Review HEP and progress PRN, progress core stabilization and hip strengthening, lifting mechanics and progress lifting, extension based movements as needed   Hildred Laser PT 08/19/23  9:02 AM Phone: [phone] Fax: [phone]

## 2023-09-03 ENCOUNTER — Encounter: Payer: Self-pay | Admitting: Physical Therapy

## 2023-09-03 ENCOUNTER — Ambulatory Visit: Payer: Medicaid Other | Attending: Family Medicine | Admitting: Physical Therapy

## 2023-09-03 ENCOUNTER — Other Ambulatory Visit: Payer: Self-pay

## 2023-09-03 DIAGNOSIS — M5459 Other low back pain: Secondary | ICD-10-CM | POA: Diagnosis present

## 2023-09-03 DIAGNOSIS — M6281 Muscle weakness (generalized): Secondary | ICD-10-CM | POA: Diagnosis present

## 2023-09-03 NOTE — Therapy (Signed)
OUTPATIENT PHYSICAL THERAPY TREATMENT   Patient Name: [name] MRN: [id number] DOB:May 16, 1966, 57 y.o., male Today's Date: 09/03/2023   END OF SESSION:  PT End of Session - 09/03/23 0855     Visit Number 8    Number of Visits 14    Date for PT Re-Evaluation 10/15/23    Authorization Type Rosann Auerbach / Rolene Arbour MCD    Authorization Time Period 07/08/2023 - 09/06/2023    Authorization - Visit Number 7    Authorization - Number of Visits 10    PT Start Time 0845    PT Stop Time 0930    PT Time Calculation (min) 45 min    Activity Tolerance Patient tolerated treatment well    Behavior During Therapy [facility] for tasks assessed/performed                   Past Medical History:  Diagnosis Date   Distal radius fracture, right    ETOH abuse    Past Surgical History:  Procedure Laterality Date   ANKLE SURGERY Right 2007   CARPAL TUNNEL RELEASE Right 05/07/2016   Procedure: RIGHT CARPAL TUNNEL RELEASE;  Surgeon: Dairl Ponder, MD;  Location: [facility];  Service: Orthopedics;  Laterality: Right;   KNEE ARTHROSCOPY Right    OPEN REDUCTION INTERNAL FIXATION (ORIF) DISTAL RADIAL FRACTURE Right 05/07/2016   Procedure: OPEN REDUCTION INTERNAL FIXATION (ORIF) RIGHT DISTAL RADIAL FRACTURE;  Surgeon: Dairl Ponder, MD;  Location: [facility];  Service: Orthopedics;  Laterality: Right;   There are no problems to display for this patient.   PCP: None  REFERRING PROVIDER: Unknown Jim, MD  REFERRING DIAG: lumbago with B/L radiculopathy  Rationale for Evaluation and Treatment: Rehabilitation  THERAPY DIAG:  Other low back pain  Muscle weakness (generalized)  ONSET DATE: worsened 5-6 months ago   SUBJECTIVE:                                                                                                                                                                                          SUBJECTIVE STATEMENT: Patient reports his  back has been feeling pretty good, it was a little stiff this morning and he did some of the exercises which loosened things up. He feels like the exercises help his lower back and he does feel stronger, he did some weights at the gym recently. He reports pain at worst with gym exercises and in the morning, but the stretching and exercises bring it down to 1/10. He does follow-up with his orthopedic tomorrow.  PAIN:  Are you having pain? Yes:  NPRS scale: 1/10 (5/10 worse) Pain  location: Lower back Pain description: Stiff, sharp Aggravating factors: Standing, walking, lifting, increase in activity Relieving factors: Arching backward, medication  PERTINENT HISTORY: See PMH above  PRECAUTIONS: None  WEIGHT BEARING RESTRICTIONS: No  PATIENT GOALS: Pain relief and get back to work and exercise without limitation   OBJECTIVE:  PATIENT SURVEYS:  FOTO 42% functional status  07/24/2023: 56%  09/03/2023: 55%  POSTURE:   Rounded shoulder posture  LUMBAR ROM:   AROM eval  Flexion WFL  Extension WFL  Right lateral flexion WFL  Left lateral flexion WFL  Right rotation WFL  Left rotation WFL   (Blank rows = not tested)  LOWER EXTREMITY MMT:    MMT Right eval Left eval Rt / Lt 07/10/2023 Rt / Lt 07/31/2023 Rt / Lt 09/03/2023  Hip flexion 4 4     Hip extension 4- 4- 4- / 4- 4 / 4 4 / 4  Hip abduction 4- 4- 4- / 4- 4 / 4 4 / 4  Hip adduction       Hip internal rotation       Hip external rotation       Knee flexion 5 5     Knee extension 5 5     Ankle dorsiflexion       Ankle plantarflexion       Ankle inversion       Ankle eversion        (Blank rows = not tested)  FUNCTIONAL TESTS:  DLLT: 45 deg with onset of low back pain  07/17/2023: 40 deg, no back pain  09/03/2023: 30 deg, no back pain   TODAY'S TREATMENT:      [facility] Adult PT Treatment:                                                DATE: 09/03/2023 Therapeutic Exercise: NuStep L9 x 6 min with UE/LE while taking  subjective Supine SLR with LTR x 5 each Marching bridge 2 x 20  90-90 alternating leg extension 2 x 20 Hex bar dead lift 95# 4 x 6 Farmer's carry 30# x 3 lengths down/back Supine piriformis stretch 3 x 20 sec each   [facility] Adult PT Treatment:                                                DATE: 08/19/23 Therapeutic Exercise: Recumbent bike L3 x 6 min LTR x 10 Cat cow 2 x 10 Bird dog x 10 - 3'' holds Side clamshell with blue 2 x 15 B Marching bridge 2 x 10  Piriformis stretch 2 x 30 sec each Open book with breathing patterns 10x B Prone press up x 10 focus on exhale  [facility] Adult PT Treatment:                                                DATE: 08/15/2023 Therapeutic Exercise: Recumbent bike L3 x 5 min while taking subjective LTR x 10 Cat cow 2 x 10 Bird dog x 12 - 3'' holds Hex bar dead lift 95# 3 x 6 Farmer's carry  25# x 3 lengths down/back each Side clamshell with blue 2 x 15 each Marching bridge 3 x 10  Piriformis stretch 3 x 20 sec each Sidelying thoracolumbar rotation x 10 each SLR + trunk rotation - 10x ea Prone press up x 10  [facility] Adult PT Treatment:                                                DATE: 07/24/2023 Therapeutic Exercise: Recumbent bike L3 x 5 min while taking subjective LTR 5 x 5 sec each Bridge x 10 with 5 sec hold Marching bridge x 10 90-90 alternating leg extension 2 x 20 Pallof press with blue 2 x 10 each Hex bar dead lift 95# 4 x 6 Prone press up 5 x 10 sec hold  Piriformis stretch 2 x 20 sec each Sidelying thoracolumbar rotation x 10 each  PATIENT EDUCATION:  Education details: POC extension, HEP update Person educated: Patient Education method: Explanation, Demonstration, Tactile cues, Verbal cues, Handout Education comprehension: verbalized understanding, returned demonstration, verbal cues required, tactile cues required, and needs further education  HOME EXERCISE PROGRAM: Access Code: [email or web address]    ASSESSMENT: CLINICAL IMPRESSION:   Patient tolerated therapy well with no adverse effects. He is progressing well in therapy and demonstrating improvement in his low back pain level with activity, hip and core strength, and return to limited gym based exercise. He does continue to report limitations with his functional ability on FOTO and exhibits core strength deficit that is impacting his return to full activity and continued pain. Therapy continues to focus primarily on progressing his core stabilization and strengthening with good tolerance. He does require cueing for lumbar control and abdominal activation to ensure proper technique and control. Updated his HEP to progress strengthening for home. Patient would benefit from continued skilled PT to progress his strength and control in order to reduce pain and maximize functional ability, so will extend PT POC for 6 more weeks.    OBJECTIVE IMPAIRMENTS: decreased activity tolerance, decreased strength, impaired flexibility, postural dysfunction, and pain.   ACTIVITY LIMITATIONS: carrying, lifting, bending, sitting, standing, squatting, and locomotion level  PARTICIPATION LIMITATIONS: meal prep, cleaning, shopping, community activity, occupation, and yard work  PERSONAL FACTORS: Fitness, Past/current experiences, and Time since onset of injury/illness/exacerbation are also affecting patient's functional outcome.    GOALS: Goals reviewed with patient? Yes  SHORT TERM GOALS: Target date: 09/24/2023  Patient will be I with initial HEP in order to progress with therapy. Baseline: HEP provided at eval 07/31/2023: independent Goal status: MET  2.  Patient will report low back pain </= 4/10 with activity in order to reduce functional limitations Baseline: 8/10 pain with activity 07/31/2023: 6/10 09/03/2023: 5/10 Goal status: ONGOING  LONG TERM GOALS: Target date: 10/15/2023  Patient will be I with final HEP to maintain progress from PT. Baseline: HEP provided at eval 09/03/2023:  progressing Goal status: ONGOING  2.  Patient will report >/= 60% status on FOTO to indicate improved functional ability. Baseline: 42% functional status 07/24/2023: 56% 09/03/2023: 55% Goal status: ONGOING  3.  Patient will exhibit DLLT </= 15 deg with good control and without onset of low back pain to indicate improved core control and activity tolerance Baseline: 45 deg 09/03/2023: 30 deg, no back pain Goal status: ONGOING  4.  Patient will exhibit hip strength >/= 4/5 MMT to  allow for reduced pain with lifting and work related tasks Baseline: 4-/5 MMT 09/03/2023: 4/5 MMT Goal status: MET  5. Patient will report no limitations with gym exercise due to low back pain so he can improve his general health and return to prior level of function  Baseline: patient limited with gym exercises due to low back pain 09/03/2023: patient reports continued limitation with gym exercises  Goal status: ONGOING   PLAN: PT FREQUENCY: 1x/week  PT DURATION: 6 weeks  PLANNED INTERVENTIONS: Therapeutic exercises, Therapeutic activity, Neuromuscular re-education, Balance training, Gait training, Patient/Family education, Self Care, Joint mobilization, Joint manipulation, Aquatic Therapy, Dry Needling, Electrical stimulation, Spinal manipulation, Spinal mobilization, Cryotherapy, Moist heat, Traction, Manual therapy, and Re-evaluation.  PLAN FOR NEXT SESSION: Review HEP and progress PRN, progress core stabilization and hip strengthening, lifting mechanics and progress lifting, extension based movements as needed   Rosana Hoes, PT, DPT, LAT, ATC 09/03/23  9:30 AM Phone: [phone] Fax: [phone]

## 2023-09-03 NOTE — Patient Instructions (Signed)
Access Code: [email or web address] URL: [email or web address] Date: 09/03/2023 Prepared by: Rosana Hoes  Exercises - Supine Piriformis Stretch with Foot on Ground  - 1 x daily - 3 reps - 30 seconds hold - Marching Bridge  - 1 x daily - 3 sets - 10 reps - Side Plank on Knees  - 1 x daily - 3 reps - 20 seconds hold - Clamshell with Resistance  - 1 x daily - 3 sets - 10 reps - Prone Press Up  - 1 x daily - 3 sets - 10 reps - Plank on Knees  - 1 x daily - 3 reps - 20 seconds hold - Standing Anti-Rotation Press with Anchored Resistance  - 1 x daily - 3 sets - 10 reps - Kettlebell Deadlift  - 1 x daily - 3 sets - 10 reps

## 2023-09-20 ENCOUNTER — Ambulatory Visit: Payer: Medicaid Other

## 2023-09-20 DIAGNOSIS — M5459 Other low back pain: Secondary | ICD-10-CM | POA: Diagnosis not present

## 2023-09-20 DIAGNOSIS — M6281 Muscle weakness (generalized): Secondary | ICD-10-CM

## 2023-09-20 NOTE — Therapy (Signed)
OUTPATIENT PHYSICAL THERAPY TREATMENT   Patient Name: [name] MRN: [id number] DOB:1966/10/19, 57 y.o., male Today's Date: 09/20/2023   END OF SESSION:  PT End of Session - 09/20/23 0915     Visit Number 9    Number of Visits 14    Date for PT Re-Evaluation 10/15/23    Authorization Type Rosann Auerbach / Rolene Arbour MCD    Authorization Time Period 07/08/2023 - 09/06/2023    Authorization - Number of Visits 10    PT Start Time 0930    PT Stop Time 1008    PT Time Calculation (min) 38 min    Activity Tolerance Patient tolerated treatment well    Behavior During Therapy WFL for tasks assessed/performed                    Past Medical History:  Diagnosis Date   Distal radius fracture, right    ETOH abuse    Past Surgical History:  Procedure Laterality Date   ANKLE SURGERY Right 2007   CARPAL TUNNEL RELEASE Right 05/07/2016   Procedure: RIGHT CARPAL TUNNEL RELEASE;  Surgeon: Dairl Ponder, MD;  Location: [facility];  Service: Orthopedics;  Laterality: Right;   KNEE ARTHROSCOPY Right    OPEN REDUCTION INTERNAL FIXATION (ORIF) DISTAL RADIAL FRACTURE Right 05/07/2016   Procedure: OPEN REDUCTION INTERNAL FIXATION (ORIF) RIGHT DISTAL RADIAL FRACTURE;  Surgeon: Dairl Ponder, MD;  Location: [facility];  Service: Orthopedics;  Laterality: Right;   There are no problems to display for this patient.   PCP: None  REFERRING PROVIDER: Unknown Jim, MD  REFERRING DIAG: lumbago with B/L radiculopathy  Rationale for Evaluation and Treatment: Rehabilitation  THERAPY DIAG:  Other low back pain  Muscle weakness (generalized)  ONSET DATE: worsened 5-6 months ago   SUBJECTIVE:                                                                                                                                                                                          SUBJECTIVE STATEMENT:  Pt presents to PT with reports of continued slight  LBP. Has been compliant with HEP with no adverse effect.   PAIN:  Are you having pain? Yes:  NPRS scale: 1/10 (5/10 worse) Pain location: Lower back Pain description: Stiff, sharp Aggravating factors: Standing, walking, lifting, increase in activity Relieving factors: Arching backward, medication  PERTINENT HISTORY: See PMH above  PRECAUTIONS: None  WEIGHT BEARING RESTRICTIONS: No  PATIENT GOALS: Pain relief and get back to work and exercise without limitation   OBJECTIVE:  PATIENT SURVEYS:  FOTO 42% functional status  07/24/2023: 56%  09/03/2023:  55%  POSTURE:   Rounded shoulder posture  LUMBAR ROM:   AROM eval  Flexion WFL  Extension WFL  Right lateral flexion WFL  Left lateral flexion WFL  Right rotation WFL  Left rotation WFL   (Blank rows = not tested)  LOWER EXTREMITY MMT:    MMT Right eval Left eval Rt / Lt 07/10/2023 Rt / Lt 07/31/2023 Rt / Lt 09/03/2023  Hip flexion 4 4     Hip extension 4- 4- 4- / 4- 4 / 4 4 / 4  Hip abduction 4- 4- 4- / 4- 4 / 4 4 / 4  Hip adduction       Hip internal rotation       Hip external rotation       Knee flexion 5 5     Knee extension 5 5     Ankle dorsiflexion       Ankle plantarflexion       Ankle inversion       Ankle eversion        (Blank rows = not tested)  FUNCTIONAL TESTS:  DLLT: 45 deg with onset of low back pain  07/17/2023: 40 deg, no back pain  09/03/2023: 30 deg, no back pain   TODAY'S TREATMENT:      [facility] Adult PT Treatment:                                                DATE: 09/20/2023 Therapeutic Exercise: NuStep L7 x 5 min with UE/LE while taking subjective Hex bar DL 3x8 16# Standing hip abd 3x10 30# Supine SLR with pilates ring 2x10 each Marching bridge 3x10 Modified side plank 2x20" each Bird dog 2x10 Dead bug with physioball x 10 (difficult) 90/90 alt leg ext 2x10 Supine piriformis stretch 2x30" Seated fig 4 stretch 2x30" each  [facility] Adult PT Treatment:                                                 DATE: 09/03/2023 Therapeutic Exercise: NuStep L9 x 6 min with UE/LE while taking subjective Supine SLR with LTR x 5 each Marching bridge 2 x 20  90-90 alternating leg extension 2 x 20 Hex bar dead lift 95# 4 x 6 Farmer's carry 30# x 3 lengths down/back Supine piriformis stretch 3 x 20 sec each   [facility] Adult PT Treatment:                                                DATE: 08/19/23 Therapeutic Exercise: Recumbent bike L3 x 6 min LTR x 10 Cat cow 2 x 10 Bird dog x 10 - 3'' holds Side clamshell with blue 2 x 15 B Marching bridge 2 x 10  Piriformis stretch 2 x 30 sec each Open book with breathing patterns 10x B Prone press up x 10 focus on exhale  [facility] Adult PT Treatment:  DATE: 08/15/2023 Therapeutic Exercise: Recumbent bike L3 x 5 min while taking subjective LTR x 10 Cat cow 2 x 10 Bird dog x 12 - 3'' holds Hex bar dead lift 95# 3 x 6 Farmer's carry 25# x 3 lengths down/back each Side clamshell with blue 2 x 15 each Marching bridge 3 x 10  Piriformis stretch 3 x 20 sec each Sidelying thoracolumbar rotation x 10 each SLR + trunk rotation - 10x ea Prone press up x 10  [facility] Adult PT Treatment:                                                DATE: 07/24/2023 Therapeutic Exercise: Recumbent bike L3 x 5 min while taking subjective LTR 5 x 5 sec each Bridge x 10 with 5 sec hold Marching bridge x 10 90-90 alternating leg extension 2 x 20 Pallof press with blue 2 x 10 each Hex bar dead lift 95# 4 x 6 Prone press up 5 x 10 sec hold  Piriformis stretch 2 x 20 sec each Sidelying thoracolumbar rotation x 10 each  PATIENT EDUCATION:  Education details: POC extension, HEP update Person educated: Patient Education method: Explanation, Demonstration, Tactile cues, Verbal cues, Handout Education comprehension: verbalized understanding, returned demonstration, verbal cues required, tactile cues required, and needs further  education  HOME EXERCISE PROGRAM: Access Code: [email or web address]    ASSESSMENT: CLINICAL IMPRESSION:  Pt was able to complete all prescribed exercises with no adverse effect. Therapy today continued to focus on improving core and proximal hip strength. Continues to benefit from skilled PT, will continue per POC.    OBJECTIVE IMPAIRMENTS: decreased activity tolerance, decreased strength, impaired flexibility, postural dysfunction, and pain.   ACTIVITY LIMITATIONS: carrying, lifting, bending, sitting, standing, squatting, and locomotion level  PARTICIPATION LIMITATIONS: meal prep, cleaning, shopping, community activity, occupation, and yard work  PERSONAL FACTORS: Fitness, Past/current experiences, and Time since onset of injury/illness/exacerbation are also affecting patient's functional outcome.    GOALS: Goals reviewed with patient? Yes  SHORT TERM GOALS: Target date: 09/24/2023  Patient will be I with initial HEP in order to progress with therapy. Baseline: HEP provided at eval 07/31/2023: independent Goal status: MET  2.  Patient will report low back pain </= 4/10 with activity in order to reduce functional limitations Baseline: 8/10 pain with activity 07/31/2023: 6/10 09/03/2023: 5/10 Goal status: ONGOING  LONG TERM GOALS: Target date: 10/15/2023  Patient will be I with final HEP to maintain progress from PT. Baseline: HEP provided at eval 09/03/2023: progressing Goal status: ONGOING  2.  Patient will report >/= 60% status on FOTO to indicate improved functional ability. Baseline: 42% functional status 07/24/2023: 56% 09/03/2023: 55% Goal status: ONGOING  3.  Patient will exhibit DLLT </= 15 deg with good control and without onset of low back pain to indicate improved core control and activity tolerance Baseline: 45 deg 09/03/2023: 30 deg, no back pain Goal status: ONGOING  4.  Patient will exhibit hip strength >/= 4/5 MMT to allow for reduced pain with lifting and work related  tasks Baseline: 4-/5 MMT 09/03/2023: 4/5 MMT Goal status: MET  5. Patient will report no limitations with gym exercise due to low back pain so he can improve his general health and return to prior level of function  Baseline: patient limited with gym exercises due to low back pain 09/03/2023: patient  reports continued limitation with gym exercises  Goal status: ONGOING   PLAN: PT FREQUENCY: 1x/week  PT DURATION: 6 weeks  PLANNED INTERVENTIONS: Therapeutic exercises, Therapeutic activity, Neuromuscular re-education, Balance training, Gait training, Patient/Family education, Self Care, Joint mobilization, Joint manipulation, Aquatic Therapy, Dry Needling, Electrical stimulation, Spinal manipulation, Spinal mobilization, Cryotherapy, Moist heat, Traction, Manual therapy, and Re-evaluation.  PLAN FOR NEXT SESSION: Review HEP and progress PRN, progress core stabilization and hip strengthening, lifting mechanics and progress lifting, extension based movements as needed  [employer] Authorization   Choose one: Rehabilitative  Standardized Assessment or Functional Outcome Tool: FOTO  Score or Percent Disability: 09/03/2023: 55%  Body Parts Treated (Select each separately):  Lumbopelvic. Overall deficits/functional limitations for body part selected: moderate N/A. Overall deficits/functional limitations for body part selected: N/A N/A. Overall deficits/functional limitations for body part selected: N/A   If treatment provided at initial evaluation, no treatment charged due to lack of authorization.       Eloy End PT  09/20/23 10:15 AM

## 2023-09-26 ENCOUNTER — Encounter: Payer: Self-pay | Admitting: Physical Therapy

## 2023-09-26 ENCOUNTER — Other Ambulatory Visit: Payer: Self-pay

## 2023-09-26 ENCOUNTER — Ambulatory Visit: Payer: Medicaid Other | Attending: Family Medicine | Admitting: Physical Therapy

## 2023-09-26 DIAGNOSIS — M5459 Other low back pain: Secondary | ICD-10-CM | POA: Insufficient documentation

## 2023-09-26 DIAGNOSIS — M6281 Muscle weakness (generalized): Secondary | ICD-10-CM | POA: Insufficient documentation

## 2023-09-26 NOTE — Therapy (Addendum)
OUTPATIENT PHYSICAL THERAPY TREATMENT  DISCHARGE   Patient Name: [name] MRN: [id number] DOB:[birth year], 57 y.o., male Today's Date: 09/26/2023   END OF SESSION:  PT End of Session - 09/26/23 0856     Visit Number 10    Number of Visits 14    Date for PT Re-Evaluation 10/15/23    Authorization Type Rosann Auerbach / Rolene Arbour MCD    Authorization Time Period auth pending    Authorization - Visit Number --    Authorization - Number of Visits --    PT Start Time 0847    PT Stop Time 0926    PT Time Calculation (min) 39 min    Activity Tolerance Patient tolerated treatment well    Behavior During Therapy [facility] for tasks assessed/performed                     Past Medical History:  Diagnosis Date   Distal radius fracture, right    ETOH abuse    Past Surgical History:  Procedure Laterality Date   ANKLE SURGERY Right 2007   CARPAL TUNNEL RELEASE Right 05/07/2016   Procedure: RIGHT CARPAL TUNNEL RELEASE;  Surgeon: Dairl Ponder, MD;  Location: [facility];  Service: Orthopedics;  Laterality: Right;   KNEE ARTHROSCOPY Right    OPEN REDUCTION INTERNAL FIXATION (ORIF) DISTAL RADIAL FRACTURE Right 05/07/2016   Procedure: OPEN REDUCTION INTERNAL FIXATION (ORIF) RIGHT DISTAL RADIAL FRACTURE;  Surgeon: Dairl Ponder, MD;  Location: [facility];  Service: Orthopedics;  Laterality: Right;   There are no problems to display for this patient.   PCP: None  REFERRING PROVIDER: Unknown Jim, MD  REFERRING DIAG: lumbago with B/L radiculopathy  Rationale for Evaluation and Treatment: Rehabilitation  THERAPY DIAG:  Other low back pain  Muscle weakness (generalized)  ONSET DATE: worsened 5-6 months ago   SUBJECTIVE:                                                                                                                                                                                          SUBJECTIVE STATEMENT:  Patient  reports his back has been good. He does the exercises at home but needs to go to the gym more often. When he goes to the gym he is able to do his regular exercise but does use lighter weight.   PAIN:  Are you having pain? Yes:  NPRS scale: 1/10 (5/10 worse) Pain location: Lower back Pain description: Stiff, sharp Aggravating factors: Standing, walking, lifting, increase in activity Relieving factors: Arching backward, medication  PERTINENT HISTORY: See PMH above  PRECAUTIONS: None  WEIGHT BEARING  RESTRICTIONS: No  PATIENT GOALS: Pain relief and get back to work and exercise without limitation   OBJECTIVE:  PATIENT SURVEYS:  FOTO 42% functional status  07/24/2023: 56%  09/03/2023: 55%  POSTURE:   Rounded shoulder posture  LUMBAR ROM:   AROM eval  Flexion WFL  Extension WFL  Right lateral flexion WFL  Left lateral flexion WFL  Right rotation WFL  Left rotation WFL   (Blank rows = not tested)  LOWER EXTREMITY MMT:    MMT Right eval Left eval Rt / Lt 07/10/2023 Rt / Lt 07/31/2023 Rt / Lt 09/03/2023  Hip flexion 4 4     Hip extension 4- 4- 4- / 4- 4 / 4 4 / 4  Hip abduction 4- 4- 4- / 4- 4 / 4 4 / 4  Hip adduction       Hip internal rotation       Hip external rotation       Knee flexion 5 5     Knee extension 5 5     Ankle dorsiflexion       Ankle plantarflexion       Ankle inversion       Ankle eversion        (Blank rows = not tested)  FUNCTIONAL TESTS:  DLLT: 45 deg with onset of low back pain  07/17/2023: 40 deg, no back pain  09/03/2023: 30 deg, no back pain   TODAY'S TREATMENT:      [facility] Adult PT Treatment:                                                DATE: 09/26/2023 Therapeutic Exercise: Recumbent bike L3 x 5 min while taking subjective LTR x 10 Marching bridge 2 x 20  90-90 alternating leg extension 2 x 20 Modified side plank 2 x 30 sec each Pallof press FM 17# x 20 Hex bar dead lift 95# x 8, 115# 3 x 6 Farmer's carry 30# with march 2 x 20  each Back extensions with 30# using squat rack 3 x 10   [facility] Adult PT Treatment:                                                DATE: 09/20/2023 Therapeutic Exercise: NuStep L7 x 5 min with UE/LE while taking subjective Hex bar DL 3x8 56# Standing hip abd 3x10 30# Supine SLR with pilates ring 2x10 each Marching bridge 3x10 Modified side plank 2x20" each Bird dog 2x10 Dead bug with physioball x 10 (difficult) 90/90 alt leg ext 2x10 Supine piriformis stretch 2x30" Seated fig 4 stretch 2x30" each  [facility] Adult PT Treatment:                                                DATE: 09/03/2023 Therapeutic Exercise: NuStep L9 x 6 min with UE/LE while taking subjective Supine SLR with LTR x 5 each Marching bridge 2 x 20  90-90 alternating leg extension 2 x 20 Hex bar dead lift 95# 4 x 6 Farmer's carry 30# x 3 lengths down/back Supine piriformis  stretch 3 x 20 sec each  [facility] Adult PT Treatment:                                                DATE: 08/19/23 Therapeutic Exercise: Recumbent bike L3 x 6 min LTR x 10 Cat cow 2 x 10 Bird dog x 10 - 3'' holds Side clamshell with blue 2 x 15 B Marching bridge 2 x 10  Piriformis stretch 2 x 30 sec each Open book with breathing patterns 10x B Prone press up x 10 focus on exhale  [facility] Adult PT Treatment:                                                DATE: 08/15/2023 Therapeutic Exercise: Recumbent bike L3 x 5 min while taking subjective LTR x 10 Cat cow 2 x 10 Bird dog x 12 - 3'' holds Hex bar dead lift 95# 3 x 6 Farmer's carry 25# x 3 lengths down/back each Side clamshell with blue 2 x 15 each Marching bridge 3 x 10  Piriformis stretch 3 x 20 sec each Sidelying thoracolumbar rotation x 10 each SLR + trunk rotation - 10x ea Prone press up x 10  [facility] Adult PT Treatment:                                                DATE: 07/24/2023 Therapeutic Exercise: Recumbent bike L3 x 5 min while taking subjective LTR 5 x 5 sec each Bridge x 10 with  5 sec hold Marching bridge x 10 90-90 alternating leg extension 2 x 20 Pallof press with blue 2 x 10 each Hex bar dead lift 95# 4 x 6 Prone press up 5 x 10 sec hold  Piriformis stretch 2 x 20 sec each Sidelying thoracolumbar rotation x 10 each  PATIENT EDUCATION:  Education details: POC extension, HEP update Person educated: Patient Education method: Explanation, Demonstration, Tactile cues, Verbal cues, Handout Education comprehension: verbalized understanding, returned demonstration, verbal cues required, tactile cues required, and needs further education  HOME EXERCISE PROGRAM: Access Code: [email or web address]    ASSESSMENT: CLINICAL IMPRESSION:  Patient tolerated therapy well with no adverse effects. Therapy focused on progressing core stabilization and strengthening with good tolerance. He was able to progress with his lifting and incorporated back extensions for isolated lumbar strengthening. No increased pain reported with therapy. He demonstrates proper mechanics with lifting but does report difficulty with isolated abdominal strengthening. No changes to HEP this visit. Patient would benefit from continued skilled PT to progress his strength and control in order to reduce pain and maximize functional ability.   OBJECTIVE IMPAIRMENTS: decreased activity tolerance, decreased strength, impaired flexibility, postural dysfunction, and pain.   ACTIVITY LIMITATIONS: carrying, lifting, bending, sitting, standing, squatting, and locomotion level  PARTICIPATION LIMITATIONS: meal prep, cleaning, shopping, community activity, occupation, and yard work  PERSONAL FACTORS: Fitness, Past/current experiences, and Time since onset of injury/illness/exacerbation are also affecting patient's functional outcome.    GOALS: Goals reviewed with patient? Yes  SHORT TERM GOALS: Target date: 09/24/2023  Patient will be I with initial HEP  in order to progress with therapy. Baseline: HEP provided at  eval 07/31/2023: independent Goal status: MET  2.  Patient will report low back pain </= 4/10 with activity in order to reduce functional limitations Baseline: 8/10 pain with activity 07/31/2023: 6/10 09/03/2023: 5/10 Goal status: ONGOING  LONG TERM GOALS: Target date: 10/15/2023  Patient will be I with final HEP to maintain progress from PT. Baseline: HEP provided at eval 09/03/2023: progressing Goal status: ONGOING  2.  Patient will report >/= 60% status on FOTO to indicate improved functional ability. Baseline: 42% functional status 07/24/2023: 56% 09/03/2023: 55% Goal status: ONGOING  3.  Patient will exhibit DLLT </= 15 deg with good control and without onset of low back pain to indicate improved core control and activity tolerance Baseline: 45 deg 09/03/2023: 30 deg, no back pain Goal status: ONGOING  4.  Patient will exhibit hip strength >/= 4/5 MMT to allow for reduced pain with lifting and work related tasks Baseline: 4-/5 MMT 09/03/2023: 4/5 MMT Goal status: MET  5. Patient will report no limitations with gym exercise due to low back pain so he can improve his general health and return to prior level of function  Baseline: patient limited with gym exercises due to low back pain 09/03/2023: patient reports continued limitation with gym exercises  Goal status: ONGOING   PLAN: PT FREQUENCY: 1x/week  PT DURATION: 6 weeks  PLANNED INTERVENTIONS: Therapeutic exercises, Therapeutic activity, Neuromuscular re-education, Balance training, Gait training, Patient/Family education, Self Care, Joint mobilization, Joint manipulation, Aquatic Therapy, Dry Needling, Electrical stimulation, Spinal manipulation, Spinal mobilization, Cryotherapy, Moist heat, Traction, Manual therapy, and Re-evaluation.  PLAN FOR NEXT SESSION: Review HEP and progress PRN, progress core stabilization and hip strengthening, lifting mechanics and progress lifting, extension based movements as needed       Rosana Hoes, PT, DPT, LAT, ATC 09/26/23  9:48 AM Phone: [phone] Fax: [phone]   PHYSICAL THERAPY DISCHARGE SUMMARY  Visits from Start of Care: 10  Current functional level related to goals / functional outcomes: See above   Remaining deficits: See above   Education / Equipment: HEP   Patient agrees to discharge. Patient goals were partially met. Patient is being discharged due to financial reasons.  Rosana Hoes, PT, DPT, LAT, ATC 10/14/23  2:59 PM Phone: [phone] Fax: [phone]

## 2023-10-03 ENCOUNTER — Ambulatory Visit: Payer: Medicaid Other | Admitting: Physical Therapy

## 2024-07-20 ENCOUNTER — Encounter: Payer: Self-pay | Admitting: Physical Medicine & Rehabilitation

## 2024-09-17 ENCOUNTER — Encounter: Payer: Self-pay | Admitting: Physical Medicine & Rehabilitation

## 2024-09-17 ENCOUNTER — Encounter: Attending: Physical Medicine & Rehabilitation | Admitting: Physical Medicine & Rehabilitation

## 2024-09-17 VITALS — BP 128/79 | HR 71 | Ht 67.0 in | Wt 185.0 lb

## 2024-09-17 DIAGNOSIS — M48062 Spinal stenosis, lumbar region with neurogenic claudication: Secondary | ICD-10-CM | POA: Insufficient documentation

## 2024-09-17 DIAGNOSIS — Z5181 Encounter for therapeutic drug level monitoring: Secondary | ICD-10-CM | POA: Insufficient documentation

## 2024-09-17 DIAGNOSIS — Z79891 Long term (current) use of opiate analgesic: Secondary | ICD-10-CM | POA: Insufficient documentation

## 2024-09-17 DIAGNOSIS — M25561 Pain in right knee: Secondary | ICD-10-CM | POA: Diagnosis present

## 2024-09-17 DIAGNOSIS — G894 Chronic pain syndrome: Secondary | ICD-10-CM | POA: Insufficient documentation

## 2024-09-17 DIAGNOSIS — G8929 Other chronic pain: Secondary | ICD-10-CM | POA: Insufficient documentation

## 2024-09-17 NOTE — Progress Notes (Addendum)
 " Subjective:     Patient ID: [name], male   DOB: [birth year], 58 y.o.   MRN: [id number]  HPI  HPI  [name] is a 58 y.o. year old male  who  has a past medical history of Distal radius fracture, right and ETOH abuse.   They are presenting to PM&R clinic as a new patient for pain management evaluation. They were referred by Dr. Barbra, [place]  for treatment of back pain.   He presents for evaluation of chronic low back pain with associated right leg pain and numbness. He has been noted to have grade 1 spondylolisthesis and severe stenosis at L4-5, bilateral L4-5 facet disease with right greater than left neural foraminal stenosis.   Pain is primarily located in his buttocks/hips particularly the left since receiving an ESI. He also has pain in his legs. He describes the pain as a sharp, burning sensation that occurs with prolonged walking. He notes that some days are better than others. He reports stiffness upon waking, which makes walking to the bathroom painful. The stiffness and pain improve after he performs his morning stretching and exercise routine. Prolonged sitting can also induce stiffness. He denies significant pain in the spine itself, stating that his exercise regimen has reduced it. The primary complaint is now buttock and hip pain.  Neurological Symptoms He reports numbness in his legs, which is intermittent. The entire right leg has previously swelled up, which resolved with elevation. The right leg still gets numb, particularly with walking or prolonged sitting. The left leg is not currently numb but does experience numbness intermittently. He also notes numbness in his feet upon waking, which improves with ambulation.  Functional Status & Aggravating/Relieving Factors Function is limited by pain and stiffness. He is unable to walk for long periods. He recounts an episode about a month ago where he had to sit down in a mall due to burning pain while walking with  his brothers. He reports that bending forward does not bother him. He must perform a series of exercises and stretches every morning before getting out of bed to line up his hip and spine, without which he is in bad shape. These exercises include lying on the floor and moving his legs in and out, twisting his spine, and arching his back with his feet against a wall. Physical therapy in the past was helpful, and he continues to perform exercises learned there and from YouTube. He goes to the gym twice weekly and has been advised by his primary doctor to increase this to three times a week. He uses a machine at [employer] where he can sit and lay back, which he feels works his hip muscles and is helpful. He has a financial trader business and moves tires, and notes that this physical activity can exacerbate his numbness. He expresses a strong desire to return to work, which includes driving trucks, but feels his condition prevents this. He is resistant to applying for disability.  Past Interventions He has seen an orthopedic specialist (Dr. Con at [facility]) and a neurosurgeon. The neurosurgeon felt he was not a surgical candidate at this time based on his functional exam.  - Injections: He has received two epidural steroid injections from orthopedics and another steroid injection from the spine specialist. The most recent may have been a right L5 transforaminal epidural steroid injection by neurosurgery. He also reports bilateral S1 transforaminal epidurals on 04/24/2024. He feels the injections shifted the pain  from his right hip to his left hip.  - Physical Therapy: Completed a 10-week course of PT at a [facility] center on [address] approximately one year ago, which he found helpful.  - Topicals: He has a VA cream from a friend that he uses every blue moon. - Tried Medications: He has tried Tramadol  50 mg from a friend on two occasions 3-4 weeks ago and found it helpful  for pain without causing sedation.    He understands the risks of taking medications not prescribed to him and agrees not to do so again.  Medication Review He is not a pill guy but will take medication if it helps his condition. He desires a treatment that will allow him to be active and functional and does not want sedating medications. - Gabapentin: Takes 600 mg capsules. He maxes out in the morning by taking two capsules (1200 mg) at once. He only takes it once per day in the morning. He feels it helps with nerve pain and he is living for that. He has become accustomed to it and it only makes him sleepy in a warm environment. He is not interested in spreading the dose out. - Naproxen:Takes two tablets every morning. - Tylenol  Extra Strength for Arthritis: Takes two 650 mg tablets every morning. - Turmeric: Takes turmeric supplements and drinks turmeric tea for inflammation. He reports no history of taking amitriptyline, nortriptyline, or Cymbalta.  Social History He holds a Class A driver's license. He expresses a desire to remain active and avoid a sedentary lifestyle. He is concerned about falls due to his condition and a prior fall he attributes to his right knee giving out.  Family History He reports a possible hereditary component to his back issues. An uncle passed away after a fall that injured his back, and his brother who was in the military also has back problems.   Reports his right knee is bone on bone with arthritis per an x-ray. This knee has had fluid on it and has been swollen in the past. It is currently doing well overall.  Neurological: As above. No bowel or bladder complaints.    Red flag symptoms: No red flags for back pain endorsed in Hx or ROS  Medications tried: Topical medications- VA cream helps alittle  Nsaids naproxen helps Tylenol   helps Opiates  Tried Tramdol 50mg  x2, worked well for his pain Gabapentin- a little sleepy with this  medication TCAs -Denies  SNRIs -Denies   Other treatments: PT- about a years ago helpful TENs unit - Denies  Injections Epidural x3 Surgery - Denies    Prior UDS results: No results found for: LABOPIA, COCAINSCRNUR, LABBENZ, AMPHETMU, THCU, LABBARB    Pain Inventory Average Pain 10 Pain Right Now 5 My pain is intermittent, constant, sharp, burning, stabbing, tingling, and aching  In the last 24 hours, has pain interfered with the following? General activity 8 Relation with others 8 Enjoyment of life 5 What TIME of day is your pain at its worst? morning  Sleep (in general) Poor  Pain is worse with: walking, sitting, standing, and some activites Pain improves with: rest, heat/ice, therapy/exercise, medication, and injections Relief from Meds: 8  ability to climb steps?  yes do you drive?  yes Do you have any goals in this area?  yes  employed # of hrs/week [name] Has you have any goals in this area?  yes  numbness tremor tingling trouble walking  Any changes since last visit?  no  Any changes since last visit?  no    Family History  Problem Relation Age of Onset   Hypertension Mother    Social History   Socioeconomic History   Marital status: Single    Spouse name: Not on file   Number of children: Not on file   Years of education: Not on file   Highest education level: Not on file  Occupational History   Occupation: truck driver  Tobacco Use   Smoking status: Never   Smokeless tobacco: Never  Vaping Use   Vaping status: Never Used  Substance and Sexual Activity   Alcohol use: Yes    Comment: rare   Drug use: No   Sexual activity: Not on file  Other Topics Concern   Not on file  Social History Narrative   Not on file   Social Drivers of Health   Financial Resource Strain: Not on File (04/12/2022)   Received from [employer]    Financial Resource Strain: 0  Food Insecurity: Low Risk  (06/22/2024)    Received from [facility]   Hunger Vital Sign    Within the past 12 months, you worried that your food would run out before you got money to buy more: Never true    Within the past 12 months, the food you bought just didn't last and you didn't have money to get more. : Never true  Transportation Needs: No Transportation Needs (06/22/2024)   Received from [facility]    In the past 12 months, has lack of reliable transportation kept you from medical appointments, meetings, work or from getting things needed for daily living? : No  Physical Activity: Not on File (04/12/2022)   Received from [facility]   Physical Activity    Physical Activity: 0  Stress: Not on File (04/12/2022)   Received from [facility]   Stress    Stress: 0  Social Connections: Not on File (09/11/2023)   Received from [facility]   Social Connections    Connectedness: 0   Past Surgical History:  Procedure Laterality Date   ANKLE SURGERY Right 2007   CARPAL TUNNEL RELEASE Right 05/07/2016   Procedure: RIGHT CARPAL TUNNEL RELEASE;  Surgeon: Donnice Robinsons, MD;  Location: [facility];  Service: Orthopedics;  Laterality: Right;   KNEE ARTHROSCOPY Right    OPEN REDUCTION INTERNAL FIXATION (ORIF) DISTAL RADIAL FRACTURE Right 05/07/2016   Procedure: OPEN REDUCTION INTERNAL FIXATION (ORIF) RIGHT DISTAL RADIAL FRACTURE;  Surgeon: Donnice Robinsons, MD;  Location: [facility];  Service: Orthopedics;  Laterality: Right;   Past Medical History:  Diagnosis Date   Distal radius fracture, right    ETOH abuse    BP 128/79   Pulse 71   Ht 5' 7 (1.702 m)   Wt 185 lb (83.9 kg)   SpO2 96%   BMI 28.98 kg/m   Opioid Risk Score:   Fall Risk Score:  `1  Depression screen [facility] 2/9     09/17/2024    9:18 AM  Depression screen PHQ 2/9  Decreased Interest 0  Down, Depressed, Hopeless 0  PHQ - 2 Score 0  Altered sleeping 0  Tired, decreased energy 0  Change in appetite 0  Feeling bad or failure  about yourself  0  Trouble concentrating 0  Moving slowly or fidgety/restless 0  Suicidal thoughts 0  PHQ-9 Score 0    Review of Systems  Cardiovascular:  Positive for leg  swelling.  Musculoskeletal:  Positive for gait problem.       Numbness in feet when walking  Neurological:  Positive for tremors and numbness.       Tingling  All other systems reviewed and are negative.      Objective:   Physical Exam  Gen: no distress, normal appearing HEENT: oral mucosa pink and moist, NCAT Chest: normal effort, normal rate of breathing Abd: soft, non-distended Ext: no edema Psych: pleasant, normal affect Skin: intact Neuro: Alert and awake, follows commands, cranial nerves II through XII grossly intact, normal speech and language RUE: 5/5 Deltoid, 5/5 Biceps, 5/5 Triceps, 5/5 Wrist Ext, 5/5 Grip LUE: 5/5 Deltoid, 5/5 Biceps, 5/5 Triceps, 5/5 Wrist Ext, 5/5 Grip RLE: HF 5/5, KE 5/5, ADF 5/5, APF 5/5 LLE: HF 5/5, KE 5/5, ADF 5/5, APF 5/5 Sensory exam normal for light touch and pain in all 4 limbs. No limb ataxia or cerebellar signs. No abnormal tone appreciated.  No ankle clonus  Musculoskeletal:  Minimal bilateral L-spine paraspinal tenderness Minimal bilateral SI joint tenderness FABER/FADIR negative-reports this helps his pain feel better SLR neg bilaterally Able to walk on toes and heels with slight difficulty of balance Reports feeling a little pain in the right buttock/hip area upon standing and walking Normal muscle bulk R Knee with mild TTP joint line, increased swelling in right knee compared to left knee but no redness or signs of infection Lumbar: Forward flexion is good, able to touch toes. Extension is reported to be helpful. Hip: Internal/external rotation in a flexed position is reported to relieve pain. Axial loading reproduces a sensation in the back but does not worsen pain.    Assessment:       ASSESSMENT: 1) Chronic low back pain with right-sided radicular  symptoms and neurogenic claudication secondary to severe L4-5 spinal stenosis. Pain is currently localized more to the left hip/buttock region.  He is managing symptoms with a home exercise program and finds activity beneficial.  2) Chronic right knee pain.  Likely OA  - Was unable to find x-ray results could consider repeat x-ray    Plan:     PLAN  1.  Medications: Will consider starting tramadol  for neuropathic pain and functional improvement.     - Discussed risks, benefits, and alternatives.     - A urine drug screen (oral swab today due to lab issues) and a pain management agreement are required per clinic policy prior to initiation.     - Instructed to call the office in approximately one week to follow up on results, at which time the prescription can be sent if appropriate.     - Advised to continue current medications (Gabapentin, Naproxen, Tylenol ) as prescribed.     - Counseled on the risks of taking medications not prescribed to him.  2.  Non-pharmacologic Management:     - TENS unit: Discussed use for non-pharmacologic pain modulation. Provided information for the [employer] company for a prescription-grade unit and also discussed over-the-counter options available on [employer], [facility], etc. A prescription will be sent for the [employer] unit.     - Diet: Provided with an anti-inflammatory diet handout. Acknowledges already incorporating many of these elements (e.g., turmeric, beets, tea).     - Exercise: Encouraged to continue his current home exercise program and gym routine, as it is providing significant benefit. Cautioned about activities that could risk a fall or direct back injury given underlying spinal stenosis.  Follow-up:As needed. Call office in one week regarding  tramadol  initiation. Instructed to seek urgent evaluation for any new or worsening weakness or onset of bowel/bladder incontinence.  Addendum1/7/26 BETHA Fidela ned sending warning regarding THC, Also noted pt needs new  pain agreement completed    "

## 2024-09-21 LAB — DRUG TOX MONITOR 1 W/CONF, ORAL FLD

## 2024-09-21 LAB — DRUG TOX ALC METAB W/CON, ORAL FLD: Alcohol Metabolite: NEGATIVE ng/mL (ref <25)

## 2024-09-30 ENCOUNTER — Telehealth: Payer: Self-pay | Admitting: Physical Medicine & Rehabilitation

## 2024-09-30 NOTE — Telephone Encounter (Signed)
 P stated he has been waiting on a phone call about his drug screening so he can get his meds

## 2024-09-30 NOTE — Telephone Encounter (Signed)
 P called and stated

## 2024-10-01 ENCOUNTER — Telehealth: Payer: Self-pay | Admitting: Physical Medicine & Rehabilitation

## 2024-10-01 MED ORDER — TRAMADOL HCL 50 MG PO TABS: 50.0000 mg | ORAL_TABLET | Freq: Two times a day (BID) | ORAL | 1 refills | Status: DC | PRN

## 2024-10-01 NOTE — Telephone Encounter (Signed)
 Pt. Requested for their Tramadol

## 2024-10-02 NOTE — Telephone Encounter (Signed)
Notified refill sent in. ?

## 2024-11-05 ENCOUNTER — Encounter: Attending: Physical Medicine & Rehabilitation | Admitting: Physical Medicine & Rehabilitation

## 2024-11-05 DIAGNOSIS — Z5181 Encounter for therapeutic drug level monitoring: Secondary | ICD-10-CM | POA: Insufficient documentation

## 2024-11-05 DIAGNOSIS — Z79891 Long term (current) use of opiate analgesic: Secondary | ICD-10-CM | POA: Insufficient documentation

## 2024-11-05 DIAGNOSIS — G894 Chronic pain syndrome: Secondary | ICD-10-CM | POA: Insufficient documentation

## 2024-11-05 DIAGNOSIS — M48062 Spinal stenosis, lumbar region with neurogenic claudication: Secondary | ICD-10-CM | POA: Insufficient documentation

## 2024-11-05 DIAGNOSIS — M25561 Pain in right knee: Secondary | ICD-10-CM | POA: Insufficient documentation

## 2024-11-05 DIAGNOSIS — G8929 Other chronic pain: Secondary | ICD-10-CM | POA: Insufficient documentation

## 2024-12-23 ENCOUNTER — Telehealth: Payer: Self-pay | Admitting: Physical Medicine & Rehabilitation

## 2024-12-23 MED ORDER — TRAMADOL HCL 50 MG PO TABS: 50.0000 mg | ORAL_TABLET | Freq: Two times a day (BID) | ORAL | 1 refills | Status: AC | PRN

## 2024-12-23 NOTE — Addendum Note (Signed)
 Addended by: URBANO ALBRIGHT on: 12/23/2024 09:24 PM   Modules accepted: Orders

## 2024-12-23 NOTE — Telephone Encounter (Signed)
 Pt need his meds (tramadol ) sent to [facility] at [address], [place]

## 2024-12-23 NOTE — Telephone Encounter (Signed)
 Pt said they have run out of med and they need refill of tramadol  50 mg.

## 2024-12-23 NOTE — Telephone Encounter (Signed)
 Patient has been advised to keep his appointments for refills. He is now on [place] NP scheduled for this Friday.

## 2024-12-23 NOTE — Telephone Encounter (Signed)
 He [facility] to [facility] [address], [place]

## 2024-12-23 NOTE — Progress Notes (Signed)
 "  Subjective:    Patient ID: [name], male    DOB: 11-Jun-1966, 58 y.o.   MRN: [id number]  HPI: [name] is a 58 y.o. male who returns for follow up appointment for chronic pain and medication refill. He states his pain is located in his lower back radiating into his lower back radiating into his buttocks and bilateral lower extremities. He rates his pain 2. His current exercise regime is walking and performing stretching exercises.  Mr. Burstein Morphine equivalent is 20.00 MME.   Oral Swab was Performed today.      Pain Inventory Average Pain 2 Pain Right Now 2  My pain is intermittent, burning, stabbing, and tingling  In the last 24 hours, has pain interfered with the following? General activity 7 Relation with others 6 Enjoyment of life 7 What TIME of day is your pain at its worst? morning  Sleep (in general) Fair  Pain is worse with: walking, bending, sitting, inactivity, standing, unsure, and some activites Pain improves with: rest, heat/ice, therapy/exercise, and medication Relief from Meds: 9  Family History  Problem Relation Age of Onset   Hypertension Mother    Social History   Socioeconomic History   Marital status: Single    Spouse name: Not on file   Number of children: Not on file   Years of education: Not on file   Highest education level: Not on file  Occupational History   Occupation: truck driver  Tobacco Use   Smoking status: Never   Smokeless tobacco: Never  Vaping Use   Vaping status: Never Used  Substance and Sexual Activity   Alcohol use: Yes    Comment: rare   Drug use: No   Sexual activity: Not on file  Other Topics Concern   Not on file  Social History Narrative   Not on file   Social Drivers of Health   Tobacco Use: Low Risk (09/17/2024)   Patient History    Smoking Tobacco Use: Never    Smokeless Tobacco Use: Never    Passive Exposure: Not on file  Financial Resource Strain: Not on File (04/12/2022)   Received  from [employer]    Financial Resource Strain: 0  Food Insecurity: Low Risk (09/21/2024)   Received from [facility]   Epic    Within the past 12 months, you worried that your food would run out before you got money to buy more: Never true    Within the past 12 months, the food you bought just didn't last and you didn't have money to get more. : Never true  Transportation Needs: No Transportation Needs (09/21/2024)   Received from [facility]    In the past 12 months, has lack of reliable transportation kept you from medical appointments, meetings, work or from getting things needed for daily living? : No  Physical Activity: Not on File (04/12/2022)   Received from [facility]   Physical Activity    Physical Activity: 0  Stress: Not on File (04/12/2022)   Received from [facility]   Stress    Stress: 0  Social Connections: Not on File (09/11/2023)   Received from [facility]   Social Connections    Connectedness: 0  Depression (PHQ2-9): Low Risk (09/17/2024)   Depression (PHQ2-9)    PHQ-2 Score: 0  Alcohol Screen: Not on file  Housing: Low Risk (09/21/2024)   Received from [facility]   Epic    What is  your living situation today?: I have a steady place to live    Think about the place you live. Do you have problems with any of the following? Choose all that apply:: None/None on this list  Utilities: Low Risk (09/21/2024)   Received from [facility]   Utilities    In the past 12 months has the electric, gas, oil, or water company threatened to shut off services in your home? : No  Health Literacy: Not on file   Past Surgical History:  Procedure Laterality Date   ANKLE SURGERY Right 2007   CARPAL TUNNEL RELEASE Right 05/07/2016   Procedure: RIGHT CARPAL TUNNEL RELEASE;  Surgeon: Donnice Robinsons, MD;  Location: [facility];  Service: Orthopedics;  Laterality: Right;   KNEE ARTHROSCOPY Right    OPEN REDUCTION INTERNAL FIXATION (ORIF) DISTAL  RADIAL FRACTURE Right 05/07/2016   Procedure: OPEN REDUCTION INTERNAL FIXATION (ORIF) RIGHT DISTAL RADIAL FRACTURE;  Surgeon: Donnice Robinsons, MD;  Location: [facility];  Service: Orthopedics;  Laterality: Right;   Past Surgical History:  Procedure Laterality Date   ANKLE SURGERY Right 2007   CARPAL TUNNEL RELEASE Right 05/07/2016   Procedure: RIGHT CARPAL TUNNEL RELEASE;  Surgeon: Donnice Robinsons, MD;  Location: [facility];  Service: Orthopedics;  Laterality: Right;   KNEE ARTHROSCOPY Right    OPEN REDUCTION INTERNAL FIXATION (ORIF) DISTAL RADIAL FRACTURE Right 05/07/2016   Procedure: OPEN REDUCTION INTERNAL FIXATION (ORIF) RIGHT DISTAL RADIAL FRACTURE;  Surgeon: Donnice Robinsons, MD;  Location:  SURGERY CENTER;  Service: Orthopedics;  Laterality: Right;   Past Medical History:  Diagnosis Date   Distal radius fracture, right    ETOH abuse    There were no vitals taken for this visit.  Opioid Risk Score:   Fall Risk Score:  `1  Depression screen [facility] 2/9     09/17/2024    9:18 AM  Depression screen PHQ 2/9  Decreased Interest 0  Down, Depressed, Hopeless 0  PHQ - 2 Score 0  Altered sleeping 0  Tired, decreased energy 0  Change in appetite 0  Feeling bad or failure about yourself  0  Trouble concentrating 0  Moving slowly or fidgety/restless 0  Suicidal thoughts 0  PHQ-9 Score 0      Data saved with a previous flowsheet row definition    Review of Systems  Musculoskeletal:  Positive for back pain.       Pain in both hips, buttocks, pain in both feet  All other systems reviewed and are negative.      Objective:   Physical Exam Vitals and nursing note reviewed.  Constitutional:      Appearance: Normal appearance.  Cardiovascular:     Rate and Rhythm: Normal rate and regular rhythm.     Pulses: Normal pulses.     Heart sounds: Normal heart sounds.  Pulmonary:     Effort: Pulmonary effort is normal.     Breath sounds: Normal  breath sounds.  Musculoskeletal:     Comments: Normal Muscle Bulk and Muscle Testing Reveals:  Upper Extremities: Full ROM and Muscle Strength 5/5 Lower Extremities: Full ROM and Muscle Strength 5/5 Arises from Table slowly Antalgic  Gait     Skin:    General: Skin is warm and dry.  Neurological:     Mental Status: He is alert and oriented to person, place, and time.  Psychiatric:        Mood and Affect: Mood normal.  Behavior: Behavior normal.          Assessment & Plan:  Lumbar Radiculitis: He will be scheduled with Dr Carilyn to discuss Injection, he verbalizes understanding.  Continue HEP as Tolerated. Continue current medication regimen. Continue to Monitor.  Chronic pain Syndrome: Refilled: Tramadol  50 mg one tablet every 12 hours as needed for pain #60. We will continue the opioid monitoring program, this consists of regular clinic visits, examinations, urine drug screen, pill counts as well as use of [place]  Controlled Substance Reporting system. A 12 month History has been reviewed on the   Controlled Substance Reporting System on 12/25/2024  F/U with Dr Carilyn   "

## 2024-12-25 ENCOUNTER — Encounter: Payer: Self-pay | Admitting: Registered Nurse

## 2024-12-25 ENCOUNTER — Encounter: Attending: Physical Medicine & Rehabilitation | Admitting: Registered Nurse

## 2024-12-25 VITALS — BP 144/81 | HR 70 | Ht 67.0 in | Wt 179.0 lb

## 2024-12-25 DIAGNOSIS — Z79891 Long term (current) use of opiate analgesic: Secondary | ICD-10-CM | POA: Insufficient documentation

## 2024-12-25 DIAGNOSIS — M5416 Radiculopathy, lumbar region: Secondary | ICD-10-CM | POA: Insufficient documentation

## 2024-12-25 DIAGNOSIS — G894 Chronic pain syndrome: Secondary | ICD-10-CM | POA: Diagnosis not present

## 2024-12-25 DIAGNOSIS — Z5181 Encounter for therapeutic drug level monitoring: Secondary | ICD-10-CM | POA: Diagnosis not present

## 2024-12-28 LAB — DRUG TOX MONITOR 1 W/CONF, ORAL FLD
Amphetamines: NEGATIVE ng/mL
Barbiturates: NEGATIVE ng/mL
Benzodiazepines: NEGATIVE ng/mL
Buprenorphine: NEGATIVE ng/mL
Cocaine: NEGATIVE ng/mL
Fentanyl: NEGATIVE ng/mL
Heroin Metabolite: NEGATIVE ng/mL
MARIJUANA: POSITIVE ng/mL — AB
MDMA: NEGATIVE ng/mL
Meprobamate: NEGATIVE ng/mL
Methadone: NEGATIVE ng/mL
Nicotine Metabolite: NEGATIVE ng/mL
Opiates: NEGATIVE ng/mL
Phencyclidine: NEGATIVE ng/mL
THC: 3.1 ng/mL — ABNORMAL HIGH
Tapentadol: NEGATIVE ng/mL
Tramadol: >500 ng/mL — ABNORMAL HIGH
Tramadol: POSITIVE ng/mL — AB
Zolpidem: NEGATIVE ng/mL

## 2024-12-28 LAB — DRUG TOX ALC METAB W/CON, ORAL FLD: Alcohol Metabolite: NEGATIVE ng/mL

## 2024-12-29 ENCOUNTER — Telehealth: Payer: Self-pay | Admitting: Registered Nurse

## 2024-12-29 ENCOUNTER — Encounter: Admitting: Physical Medicine & Rehabilitation

## 2024-12-29 NOTE — Telephone Encounter (Signed)
 I was going to send letter, but I do not see a contract in the chart.

## 2024-12-29 NOTE — Telephone Encounter (Signed)
 Oral Swab was Reviewed.  +THC Call placed to [name] he admits in the past he smoked marijuana to help with his neuropathic pain. We discussed the narcotic contract and he verbalizes understanding. He is aware if this occurs again, this can lead to be discharged from our office. He verbalizes understanding.  Sybil RN will send him a letter and Dr. Urbano will be aware of the above.

## 2025-01-07 NOTE — Telephone Encounter (Signed)
 Formal warning letter sent. CSA to be signed 01/12/25 appt.

## 2025-01-12 ENCOUNTER — Encounter: Admitting: Physical Medicine & Rehabilitation

## 2025-02-16 ENCOUNTER — Encounter: Admitting: Physical Medicine & Rehabilitation

## 2025-03-25 ENCOUNTER — Encounter: Admitting: Physical Medicine & Rehabilitation
